# Patient Record
Sex: Female | Born: 1965 | Race: Black or African American | Hispanic: No | Smoking: Never smoker
Health system: Southern US, Community
[De-identification: ages and names within clinical notes are randomized; demographics above are authoritative.]

## PROBLEM LIST (undated history)

## (undated) DIAGNOSIS — I10 Essential (primary) hypertension: Secondary | ICD-10-CM

## (undated) DIAGNOSIS — S0591XA Unspecified injury of right eye and orbit, initial encounter: Secondary | ICD-10-CM

## (undated) DIAGNOSIS — D649 Anemia, unspecified: Secondary | ICD-10-CM

## (undated) DIAGNOSIS — Z970 Presence of artificial eye: Secondary | ICD-10-CM

## (undated) DIAGNOSIS — Z9289 Personal history of other medical treatment: Secondary | ICD-10-CM

## (undated) HISTORY — PX: OTHER SURGICAL HISTORY: SHX169

## (undated) HISTORY — DX: Essential (primary) hypertension: I10

## (undated) HISTORY — PX: TUBAL LIGATION: SHX77

---

## 1997-10-29 ENCOUNTER — Other Ambulatory Visit: Admission: RE | Admit: 1997-10-29 | Discharge: 1997-10-29 | Payer: Self-pay | Admitting: Obstetrics and Gynecology

## 2008-01-06 ENCOUNTER — Ambulatory Visit: Payer: Self-pay | Admitting: Internal Medicine

## 2008-01-06 DIAGNOSIS — I10 Essential (primary) hypertension: Secondary | ICD-10-CM

## 2008-01-06 HISTORY — DX: Essential (primary) hypertension: I10

## 2008-01-31 ENCOUNTER — Ambulatory Visit: Payer: Self-pay

## 2008-02-22 ENCOUNTER — Ambulatory Visit: Payer: Self-pay | Admitting: Internal Medicine

## 2008-02-22 LAB — CONVERTED CEMR LAB
BUN: 19 mg/dL (ref 6–23)
Basophils Absolute: 0.1 10*3/uL (ref 0.0–0.1)
Basophils Relative: 1.2 % (ref 0.0–3.0)
Calcium: 9.3 mg/dL (ref 8.4–10.5)
Creatinine, Ser: 0.6 mg/dL (ref 0.4–1.2)
Eosinophils Absolute: 0.1 10*3/uL (ref 0.0–0.7)
GFR calc Af Amer: 140 mL/min
GFR calc non Af Amer: 116 mL/min
Glucose, Bld: 93 mg/dL (ref 70–99)
Lymphocytes Relative: 28.7 % (ref 12.0–46.0)
Monocytes Absolute: 0.4 10*3/uL (ref 0.1–1.0)
Neutro Abs: 3.2 10*3/uL (ref 1.4–7.7)
Platelets: 300 10*3/uL (ref 150–400)
Saturation Ratios: 4.3 % — ABNORMAL LOW (ref 20.0–50.0)
Vitamin B-12: 424 pg/mL (ref 211–911)
WBC: 5.3 10*3/uL (ref 4.5–10.5)

## 2008-02-23 ENCOUNTER — Encounter: Payer: Self-pay | Admitting: Internal Medicine

## 2008-03-28 ENCOUNTER — Encounter: Payer: Self-pay | Admitting: Obstetrics & Gynecology

## 2008-03-28 ENCOUNTER — Ambulatory Visit: Payer: Self-pay | Admitting: Obstetrics and Gynecology

## 2008-03-28 LAB — CONVERTED CEMR LAB
Trich, Wet Prep: NONE SEEN
Yeast Wet Prep HPF POC: NONE SEEN

## 2008-05-25 ENCOUNTER — Ambulatory Visit: Payer: Self-pay | Admitting: Family Medicine

## 2010-01-24 ENCOUNTER — Inpatient Hospital Stay (HOSPITAL_COMMUNITY)
Admission: AD | Admit: 2010-01-24 | Discharge: 2010-01-24 | Payer: Self-pay | Source: Home / Self Care | Attending: Family Medicine | Admitting: Family Medicine

## 2010-02-12 ENCOUNTER — Ambulatory Visit: Payer: Self-pay | Admitting: Obstetrics and Gynecology

## 2010-02-13 ENCOUNTER — Encounter: Payer: Self-pay | Admitting: Obstetrics and Gynecology

## 2010-02-13 LAB — CONVERTED CEMR LAB: Trich, Wet Prep: NONE SEEN

## 2010-02-24 ENCOUNTER — Ambulatory Visit (HOSPITAL_COMMUNITY)
Admission: RE | Admit: 2010-02-24 | Discharge: 2010-02-24 | Payer: Self-pay | Source: Home / Self Care | Attending: Family Medicine | Admitting: Family Medicine

## 2010-03-09 ENCOUNTER — Encounter: Payer: Self-pay | Admitting: Obstetrics and Gynecology

## 2010-03-16 LAB — CONVERTED CEMR LAB
ALT: 18 units/L (ref 0–35)
Basophils Absolute: 0.1 10*3/uL (ref 0.0–0.1)
Bilirubin Urine: NEGATIVE
CO2: 29 meq/L (ref 19–32)
Calcium: 9.1 mg/dL (ref 8.4–10.5)
Cholesterol: 157 mg/dL (ref 0–200)
Creatinine, Ser: 0.6 mg/dL (ref 0.4–1.2)
Crystals: NEGATIVE
Eosinophils Absolute: 0.1 10*3/uL (ref 0.0–0.7)
GFR calc Af Amer: 140 mL/min
Glucose, Bld: 87 mg/dL (ref 70–99)
HCT: 29.9 % — ABNORMAL LOW (ref 36.0–46.0)
HDL: 76.7 mg/dL (ref >39.0)
Hemoglobin, Urine: NEGATIVE
Hemoglobin: 9.9 g/dL — ABNORMAL LOW (ref 12.0–15.0)
MCHC: 33.2 g/dL (ref 30.0–36.0)
MCV: 77.1 fL — ABNORMAL LOW (ref 78.0–100.0)
Monocytes Absolute: 0.3 10*3/uL (ref 0.1–1.0)
Monocytes Relative: 7.5 % (ref 3.0–12.0)
Neutro Abs: 2.3 10*3/uL (ref 1.4–7.7)
Nitrite: NEGATIVE
Platelets: 220 10*3/uL (ref 150–400)
RDW: 18.2 % — ABNORMAL HIGH (ref 11.5–14.6)
TSH: 0.46 microintl units/mL (ref 0.35–5.50)
Total Bilirubin: 0.6 mg/dL (ref 0.3–1.2)
Total Protein, Urine: NEGATIVE mg/dL
Total Protein: 7.1 g/dL (ref 6.0–8.3)
Triglycerides: 94 mg/dL (ref 0–149)
Urobilinogen, UA: 0.2 (ref 0.0–1.0)

## 2010-03-27 ENCOUNTER — Ambulatory Visit (INDEPENDENT_AMBULATORY_CARE_PROVIDER_SITE_OTHER): Payer: BC Managed Care – PPO | Admitting: Obstetrics and Gynecology

## 2010-03-27 ENCOUNTER — Encounter: Payer: Self-pay | Admitting: Obstetrics and Gynecology

## 2010-03-27 DIAGNOSIS — N898 Other specified noninflammatory disorders of vagina: Secondary | ICD-10-CM

## 2010-03-27 LAB — CONVERTED CEMR LAB
Trich, Wet Prep: NONE SEEN
Yeast Wet Prep HPF POC: NONE SEEN

## 2010-05-09 NOTE — Progress Notes (Unsigned)
NAME:  [name], [name] NO.:  [id number]  MEDICAL RECORD NO.:  [id number]           PATIENT TYPE:  A  LOCATION:  [facility]                   FACILITY:  [facility]  PHYSICIAN:  Argentina Donovan, MD        DATE OF BIRTH:  07-Dec-1965  DATE OF SERVICE:                                 CLINIC NOTE  The patient is a 45 year old African American female who was recently in for her annual exam, comes back today with heavy vaginal discharge and severe vulvar itching.  On examination, she has a heavy creamy discharge.  Vagina is clean, otherwise well rugated with a clean and nulliparous cervix.  Wet prep was taken.  The patient has been given a prescription for Terazol 7 cream plus Mycolog-II cream externally.  In advance of the results of the wet prep coming back and be reviewed tomorrow and if additional medication needed to be applied.  IMPRESSION:  Monilial vulvovaginitis.          ______________________________ Argentina Donovan, MD    PR/MEDQ  D:  03/27/2010  T:  03/28/2010  Job:  [id number]

## 2010-07-01 NOTE — Group Therapy Note (Signed)
NAMEDOMENIQUE, QUEST NO.:  [id number]   MEDICAL RECORD NO.:  [id number]          PATIENT TYPE:  WOC   LOCATION:  [facility]                   FACILITY:  [facility]   PHYSICIAN:  Scheryl Darter, MD       DATE OF BIRTH:  1965/04/30   DATE OF SERVICE:  03/28/2008                                  CLINIC NOTE   HISTORY OF PRESENT ILLNESS:  The patient presents for her yearly exam.  The patient is a 45 year old black female gravida 2, para 2, last  menstrual period 03/05/2008 who had tubal ligation.  She is currently  not sexually active.  Last Pap smear was March 2008.  No history of  abnormalities.  No complaints of problems with her periods.  She  recently started being followed at [facility] for management of  hypertension.   PAST MEDICAL HISTORY:  1. Hypertension.  2. Hypokalemia.  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  2. Two prior C-sections.  3. Tubal ligation.   FAMILY HISTORY:  Breast cancer, coronary heart disease and hypertension.   SOCIAL HISTORY:  The patient is a nonsmoker.  She denies alcohol use.  She is currently not sexually active.  She denies any physical abuse.   ALLERGIES:  No known drug allergies.   MEDICATIONS:  1. Benicar 20 mg daily.  2. K-Lor 20 mEq 1 p.o. daily.   REVIEW OF SYSTEMS:  Negative for any bowel complaints, stomach problems  or bladder symptoms.  She has had some weight gain since starting  treatment for hypertension.   PHYSICAL EXAMINATION:  GENERAL APPEARANCE:  No acute distress.  VITAL SIGNS:  Weight 98.5 pounds, height 4 foot 11, BP 122/81, pulse 88,  temperature 97.8.  CHEST:  Clear.  CARDIOVASCULAR:  Heart regular rate and rhythm.  BREASTS:  Symmetric without masses, but with multiple cystic cutaneous  lesions over body.  ABDOMEN:  Flat, soft and nontender.  EXTREMITIES:  No swelling.  PELVIC:  Normal external  genitalia.  Vagina and cervix appear normal.  Pap was done.  Uterus is normal size.  No adnexal masses or tenderness.  There is slight discharge and she had noticed a malodorous discharge  prior to this with a positive over-the-counter for Trichomonas,  bacterial vaginosis and yeast.   IMPRESSION:  1. Normal gynecologic exam.  2. Probable bacterial vaginosis.   PLAN:  Flagyl 2 grams p.o. single dose.  Wet mount was sent today.  Return for yearly exams.  I have recommended she schedule a screening  mammogram.  She will continue to follow with her internist at [facility].      Scheryl Darter, MD     JA/MEDQ  D:  03/28/2008  T:  03/28/2008  Job:  [id number]

## 2010-07-01 NOTE — Group Therapy Note (Signed)
NAMEALEXANDRIA, [name] NO.:  [id number]   MEDICAL RECORD NO.:  [id number]          PATIENT TYPE:  WOC   LOCATION:  [facility]                   FACILITY:  [facility]   PHYSICIAN:  Scheryl Darter, MD       DATE OF BIRTH:  [birth year]   DATE OF SERVICE:  05/25/2008                                  CLINIC NOTE   CHIEF COMPLAINT:  Vaginal discharge.   The patient is a 45 year old black female, gravida 2, para 2, last  menstrual period on May 16, 2008, status post tubal ligation.  She  says that since the onset of her last period , she has had chalky  vaginal discharge and a fishy odor.  She thinks it may be bacterial  vaginosis.  She was recently treated for bacterial vaginosis with  Flagyl, single dose.  No complaints of pain or fever or vaginal itching.   PHYSICAL EXAMINATION:  GENERAL:  The patient is in no acute distress.  PELVIC:  External genitalia and vagina showed slight white discharge,  which appears to be most consistent with yeast.  Cervix is normal.  Bimanual exam was deferred.  Wet mount today showed normal epithelial  cells, few white cells, a few hyphae, and other fungal elements.   IMPRESSION:  Vaginal yeast infection.   PLAN:  Diflucan 150 mg p.o. single dose.  The patient is to report if  symptoms does not improve.      Scheryl Darter, MD     JA/MEDQ  D:  05/25/2008  T:  05/25/2008  Job:  [id number]

## 2011-04-03 ENCOUNTER — Encounter: Payer: Self-pay | Admitting: Family

## 2011-04-03 ENCOUNTER — Ambulatory Visit (INDEPENDENT_AMBULATORY_CARE_PROVIDER_SITE_OTHER): Payer: BC Managed Care – PPO | Admitting: Family

## 2011-04-03 DIAGNOSIS — L723 Sebaceous cyst: Secondary | ICD-10-CM

## 2011-04-03 DIAGNOSIS — Z01419 Encounter for gynecological examination (general) (routine) without abnormal findings: Secondary | ICD-10-CM

## 2011-04-03 DIAGNOSIS — L728 Other follicular cysts of the skin and subcutaneous tissue: Secondary | ICD-10-CM | POA: Insufficient documentation

## 2011-04-03 DIAGNOSIS — N852 Hypertrophy of uterus: Secondary | ICD-10-CM

## 2011-04-03 MED ORDER — METRONIDAZOLE 500 MG PO TABS: 500.0000 mg | ORAL_TABLET | Freq: Two times a day (BID) | ORAL | Status: AC

## 2011-04-03 NOTE — Progress Notes (Signed)
  Subjective:     [name] is a 46 y.o. female here for a routine exam.  Current complaints: white vaginal discharge with odor; intermittent x 6 years, notices it more after cycle.     Gynecologic History Patient's last menstrual period was 03/18/2011. Contraception: tubal ligation Last Pap: 01/2010. Results were: normal Last mammogram: January 2011. Results were: normal.  Plans to schedule appointment.  Obstetric History OB History    Grav Para Term Preterm Abortions TAB SAB Ect Mult Living   2 2 2       2      # Outc Date GA Lbr Len/2nd Wgt Sex Del Anes PTL Lv   1 TRM            2 TRM               The following portions of the patient's history were reviewed and updated as appropriate: allergies, current medications, past family history, past medical history, past social history, past surgical history and problem list. Review of Systems Pertinent items are noted in HPI.    Objective:   BP 145/89  Pulse 81  Temp(Src) 98.9 F (37.2 C) (Oral)  Ht 4\' 11"  (1.499 m)  Wt 102 lb 3.2 oz (46.358 kg)  BMI 20.64 kg/m2  LMP 03/18/2011  General appearance: alert, cooperative and appears stated age Head: Normocephalic, without obvious abnormality, atraumatic Neck: no adenopathy, no carotid bruit, no JVD, supple, symmetrical, trachea midline and thyroid not enlarged, symmetric, no tenderness/mass/nodules Lungs: clear to auscultation bilaterally Breasts: normal appearance, no tenderness, No nipple retraction or dimpling, No nipple discharge or bleeding, No axillary or supraclavicular adenopathy, scattered nodule masses scattered through (related to chronic skin condition since 7th grade) Taught monthly breast self examination Heart: regular rate and rhythm, S1, S2 normal, no murmur, click, rub or gallop Abdomen: soft, non-tender; bowel sounds normal; no masses,  no organomegaly Pelvic: cervix normal in appearance, external genitalia normal, no adnexal masses or tenderness, no  cervical motion tenderness, rectovaginal septum normal, uterus enlarged and vagina normal without discharge Skin: Skin color, texture, turgor normal. No rashes.  Scattered nodular lesions throughout body   Assessment:    Healthy female exam.   Vaginal Discharge Enlarged Uterus Plan:    Contraception: tubal ligation.   Pap w/GC/CT Wet Prep Pelvic ultrasound Schedule mammogram RX flagyl

## 2011-04-04 LAB — WET PREP, GENITAL: Trich, Wet Prep: NONE SEEN

## 2011-04-09 ENCOUNTER — Ambulatory Visit (HOSPITAL_COMMUNITY)
Admission: RE | Admit: 2011-04-09 | Discharge: 2011-04-09 | Disposition: A | Discharge status: Home / Self Care | Payer: BC Managed Care – PPO | Source: Ambulatory Visit | Attending: Family | Admitting: Family

## 2011-04-09 DIAGNOSIS — N852 Hypertrophy of uterus: Secondary | ICD-10-CM | POA: Insufficient documentation

## 2011-04-09 DIAGNOSIS — D259 Leiomyoma of uterus, unspecified: Secondary | ICD-10-CM | POA: Insufficient documentation

## 2011-04-11 ENCOUNTER — Encounter: Payer: Self-pay | Admitting: Family

## 2011-04-11 DIAGNOSIS — D219 Benign neoplasm of connective and other soft tissue, unspecified: Secondary | ICD-10-CM | POA: Insufficient documentation

## 2011-04-11 HISTORY — DX: Benign neoplasm of connective and other soft tissue, unspecified: D21.9

## 2011-04-16 ENCOUNTER — Other Ambulatory Visit: Payer: Self-pay | Admitting: Obstetrics and Gynecology

## 2011-04-16 ENCOUNTER — Telehealth: Payer: Self-pay | Admitting: *Deleted

## 2011-04-16 DIAGNOSIS — B379 Candidiasis, unspecified: Secondary | ICD-10-CM

## 2011-04-16 MED ORDER — FLUCONAZOLE 150 MG PO TABS: 150.0000 mg | ORAL_TABLET | Freq: Once | ORAL | Status: AC

## 2011-04-16 NOTE — Telephone Encounter (Signed)
Patient notified of transvaginal ultrasound result-- per Maylon Cos, [facility] result shows fibroids which are non-cancerous; requires no treatment unless it is causing her pain or having abnormal bleed. At that point she can make an appointment for further evaluation. Patient agrees.  Patient also added a c/o yellow vaginal discharge with odor and itch. Per Maylon Cos- ok to Rx Diflucan 150mg  one time dose--per standing orders. Wet prep from last visit shows moderate for yeast also. Pt agrees and Rx sent.

## 2011-04-16 NOTE — Telephone Encounter (Signed)
Patient called and left a message requesting test results of Ultrasound done 04/09/11. States can be reached at home number [phone] until 12 or cell phone   [phone] after 2 today

## 2012-11-21 ENCOUNTER — Inpatient Hospital Stay (HOSPITAL_COMMUNITY)
Admission: AD | Admit: 2012-11-21 | Discharge: 2012-11-21 | Disposition: A | Discharge status: Home / Self Care | Payer: Medicaid Other | Source: Ambulatory Visit | Attending: Obstetrics & Gynecology | Admitting: Obstetrics & Gynecology

## 2012-11-21 DIAGNOSIS — N764 Abscess of vulva: Secondary | ICD-10-CM | POA: Insufficient documentation

## 2012-11-21 MED ORDER — CEPHALEXIN 500 MG PO CAPS: 500.0000 mg | ORAL_CAPSULE | Freq: Four times a day (QID) | ORAL | Status: DC

## 2012-11-21 MED ORDER — LIDOCAINE HCL (PF) 1 % IJ SOLN
INTRAMUSCULAR | Status: DC
Start: 2012-11-21 — End: 2012-11-21
  Filled 2012-11-21: qty 30

## 2012-11-21 MED ORDER — TRAMADOL HCL 50 MG PO TABS: 50.0000 mg | ORAL_TABLET | Freq: Four times a day (QID) | ORAL | Status: DC | PRN

## 2012-11-21 NOTE — MAU Note (Signed)
Patient states she had an abscess about one year ago that had to be I & D's. States states another abscess started about one week and is now large and painful on the right labia.

## 2012-11-21 NOTE — MAU Provider Note (Signed)
History     CSN: [id number]  Arrival date and time: 11/21/12 6295   None     Chief Complaint  Patient presents with  . Abscess   HPI This is a 46 y.o. female who presents with c/o right labial abscess with a smaller one beneath it. Has had them in this area before. States thinks they come from shaving. Has had I&D done before.   RN Note: Patient states she had an abscess about one year ago that had to be I & D's. States states another abscess started about one week and is now large and painful on the right labia.        OB History   Grav Para Term Preterm Abortions TAB SAB Ect Mult Living   2 2 2       2       Past Medical History  Diagnosis Date  . Hypertension     Past Surgical History  Procedure Laterality Date  . Cesarean section      2 previous  . Tubal ligation      Family History  Problem Relation Age of Onset  . Hypertension Mother   . Cancer Maternal Aunt     breast    History  Substance Use Topics  . Smoking status: Never Smoker   . Smokeless tobacco: Never Used  . Alcohol Use: No    Allergies: No Known Allergies  Prescriptions prior to admission  Medication Sig Dispense Refill  . olmesartan (BENICAR) 20 MG tablet Take 20 mg by mouth daily.        Review of Systems  Constitutional: Negative for fever, chills and malaise/fatigue.  Gastrointestinal: Negative for nausea, vomiting, abdominal pain, diarrhea and constipation.  Genitourinary:       States has had enlarged inguinal lymph nodes on right "since 1996"  As well as some in middle abdomen. States she was told they "are fine".   Physical Exam   Blood pressure 137/89, pulse 80, temperature 98.3 F (36.8 C), temperature source Oral, resp. rate 18, height 5\' 1"  (1.549 m), weight 53.252 kg (117 lb 6.4 oz), last menstrual period 11/11/2012, SpO2 100.00%.  Physical Exam  Constitutional: She is oriented to person, place, and time. She appears well-developed and well-nourished. No distress.   Cardiovascular: Normal rate.   Respiratory: Effort normal.  GI: Soft. She exhibits mass (Several half centimeter nodes palpable in Right inguinal area, nontender, not inflamed). She exhibits no distension. There is no tenderness. There is no rebound and no guarding.  Genitourinary:     Two areas of fluctuance right labia   Musculoskeletal: Normal range of motion.  Neurological: She is alert and oriented to person, place, and time.  Skin: Skin is warm and dry.  Psychiatric: She has a normal mood and affect.    MAU Course  Procedures Labial Abscess I&D  Enlarged abscess palpated on right labia and a second smaller one just below that.   Anterior one is 1.5x3.5cm.  Other is 1x2.   Written informed consent was obtained.  Discussed complications and possible outcomes of procedure including recurrence of cyst, scarring leading to infecton, bleeding, dyspareunia, distortion of anatomy.  Patient was examined in the dorsal lithotomy position and mass was identified.  The area was prepped with Iodine and draped in a sterile manner. 1% Lidocaine (3 ml) was then used to infiltrate area on abscesses.    A 2 mm incision was made using a sterile scapel. Upon palpation of the mass, a moderate  amount of purulent drainage was expressed through the incision. A hemostat was used to break up loculations, which resulted in expression of slightly more purulent drainage. Smaller abscess treated similarly but there was only scant fluid returned there.  Patient tolerated the procedure well. - Keflex bid x 7 days for treatment - Recommended Sitz baths bid and Motrin and Tramadol was given  prn pain.   She was told to call to be examined if she experiences increasing swelling, pain, vaginal discharge, or fever.  - She was instructed to wear a peripad to absorb discharge, and to maintain pelvic rest while healing.  .   Assessment and Plan  A:  Right labial abscesses      Successful drainage of larger abscess  P:   Discharge        Rx Keflex x 7d       Warm soaks       Follow up in clinic     [facility] 11/21/2012, 1:40 PM

## 2012-11-21 NOTE — MAU Provider Note (Signed)
Attestation of Attending Supervision of Advanced Practitioner (CNM/NP): Evaluation and management procedures were performed by the Advanced Practitioner under my supervision and collaboration.  I have reviewed the Advanced Practitioner's note and chart, and I agree with the management and plan.  HARRAWAY-SMITH, Kaylah Chiasson 6:45 PM     

## 2013-12-18 ENCOUNTER — Encounter: Payer: Self-pay | Admitting: Family

## 2016-11-19 ENCOUNTER — Encounter (HOSPITAL_COMMUNITY): Payer: Self-pay | Admitting: *Deleted

## 2016-11-19 ENCOUNTER — Inpatient Hospital Stay (HOSPITAL_COMMUNITY)
Admission: AD | Admit: 2016-11-19 | Discharge: 2016-11-20 | DRG: 812 | Disposition: A | Discharge status: Home / Self Care | Payer: BLUE CROSS/BLUE SHIELD | Source: Ambulatory Visit | Attending: Obstetrics and Gynecology | Admitting: Obstetrics and Gynecology

## 2016-11-19 DIAGNOSIS — D252 Subserosal leiomyoma of uterus: Secondary | ICD-10-CM | POA: Diagnosis present

## 2016-11-19 DIAGNOSIS — D219 Benign neoplasm of connective and other soft tissue, unspecified: Secondary | ICD-10-CM | POA: Diagnosis present

## 2016-11-19 DIAGNOSIS — D259 Leiomyoma of uterus, unspecified: Secondary | ICD-10-CM

## 2016-11-19 DIAGNOSIS — D62 Acute posthemorrhagic anemia: Principal | ICD-10-CM | POA: Diagnosis present

## 2016-11-19 DIAGNOSIS — N939 Abnormal uterine and vaginal bleeding, unspecified: Secondary | ICD-10-CM | POA: Diagnosis present

## 2016-11-19 DIAGNOSIS — Z9289 Personal history of other medical treatment: Secondary | ICD-10-CM

## 2016-11-19 HISTORY — DX: Acute posthemorrhagic anemia: D62

## 2016-11-19 HISTORY — DX: Essential (primary) hypertension: I10

## 2016-11-19 HISTORY — DX: Personal history of other medical treatment: Z92.89

## 2016-11-19 LAB — ABO/RH: ABO/RH(D): B POS

## 2016-11-19 LAB — POCT PREGNANCY, URINE: Preg Test, Ur: NEGATIVE

## 2016-11-19 LAB — CBC
HEMATOCRIT: 18.7 % — AB (ref 36.0–46.0)
HEMOGLOBIN: 5.7 g/dL — AB (ref 12.0–15.0)
MCH: 21.2 pg — AB (ref 26.0–34.0)
MCHC: 30.5 g/dL (ref 30.0–36.0)
MCV: 69.5 fL — ABNORMAL LOW (ref 78.0–100.0)
Platelets: 210 10*3/uL (ref 150–400)
RBC: 2.69 MIL/uL — ABNORMAL LOW (ref 3.87–5.11)
RDW: 21.7 % — AB (ref 11.5–15.5)
WBC: 5.9 10*3/uL (ref 4.0–10.5)

## 2016-11-19 LAB — URINALYSIS, ROUTINE W REFLEX MICROSCOPIC
Bilirubin Urine: NEGATIVE
Glucose, UA: NEGATIVE mg/dL
Ketones, ur: NEGATIVE mg/dL
Leukocytes, UA: NEGATIVE
NITRITE: NEGATIVE
PROTEIN: NEGATIVE mg/dL
Specific Gravity, Urine: 1.018 (ref 1.005–1.030)
pH: 5 (ref 5.0–8.0)

## 2016-11-19 LAB — PROTIME-INR
INR: 1.04
Prothrombin Time: 13.5 seconds (ref 11.4–15.2)

## 2016-11-19 LAB — WET PREP, GENITAL
Sperm: NONE SEEN
TRICH WET PREP: NONE SEEN
YEAST WET PREP: NONE SEEN

## 2016-11-19 LAB — APTT: APTT: 31 s (ref 24–36)

## 2016-11-19 LAB — PREPARE RBC (CROSSMATCH)

## 2016-11-19 MED ORDER — SODIUM CHLORIDE 0.9 % IV SOLN: Freq: Once | INTRAVENOUS | Status: DC

## 2016-11-19 MED ORDER — TRANEXAMIC ACID 1000 MG/10ML IV SOLN
500.0000 mg | Freq: Three times a day (TID) | INTRAVENOUS | Status: DC
  Administered 2016-11-19 – 2016-11-20 (×2): 500 mg via INTRAVENOUS
  Filled 2016-11-19 (×4): qty 5

## 2016-11-19 NOTE — MAU Provider Note (Signed)
History     CSN: [id number] Arrival date and time: 11/19/16 8938  First Provider Initiated Contact with Patient 11/19/16 2023      Chief Complaint  Patient presents with  . Vaginal Bleeding    HPI: [name] is a 51 y.o. G31P2002 female with history of fibroids who present to MAU with c/c of heavy vaginal bleeding. She reports that she had had her menses since May, then it started on 11/01/16 and has persisted. For the last 4 days menses has gotten a lot heavier, and passing some clots. She reports that she "fills up a tampon" if she sneezes.  For the last couple of days she has been feeling dizzy and mildly nauseous. Denies any SOB, DOE or chest, fever, chills, or any other concerns. She is otherwise healthy, other than history of HTN (has not been taking BP med d/t cost). Denies h/o DVT/PE, CVA, MI, or smoking.   Past obstetric history: OB History  Gravida Para Term Preterm AB Living  2 2 2     2   SAB TAB Ectopic Multiple Live Births               # Outcome Date GA Lbr Len/2nd Weight Sex Delivery Anes PTL Lv  2 Term           1 Term               Past Medical History:  Diagnosis Date  . Hypertension     Past Surgical History:  Procedure Laterality Date  . CESAREAN SECTION     2 previous  . TUBAL LIGATION      Family History  Problem Relation Age of Onset  . Hypertension Mother   . Cancer Maternal Aunt        breast    Social History  Substance Use Topics  . Smoking status: Never Smoker  . Smokeless tobacco: Never Used  . Alcohol use No    Allergies: No Known Allergies  Prescriptions Prior to Admission  Medication Sig Dispense Refill Last Dose  . Acetaminophen-Caff-Pyrilamine (MIDOL COMPLETE) 500-60-15 MG TABS Take 2 tablets by mouth once. For pain.   11/19/2016 at Unknown time  . Naproxen Sodium (ALEVE PO) Take by mouth.   11/18/2016 at Unknown time  . cephALEXin (KEFLEX) 500 MG capsule Take 1 capsule (500 mg total) by mouth 4 (four) times daily. 28  capsule 0   . olmesartan (BENICAR) 20 MG tablet Take 20 mg by mouth daily.   Past Week at Unknown  . traMADol (ULTRAM) 50 MG tablet Take 1 tablet (50 mg total) by mouth every 6 (six) hours as needed for pain. 30 tablet 0     Review of Systems - Negative except for what is mentioned in HPI.  Physical Exam   Blood pressure (!) 148/82, pulse 99, temperature 98.3 F (36.8 C), temperature source Oral, resp. rate 16, height 5' (1.524 m), weight 108 lb (49 kg).  Constitutional: Thing female in no acute distress.  HENT: [place]/AT, normal oropharynx mucosa. MMM Eyes: normal conjunctivae, no scleral icterus Cardiovascular: normal rate, regular rhythm Respiratory: normal effort, lungs CTAB.  GI: Abd soft, non-tender, uterus palpable to just below the umbilicus  Pelvic: NEFG, moderate amount of blood in vaginal vault with small clots, cervix normal appearing. Uterus ~18-week size on bimanual exam with palpable fibroids.  MSK: Extremities nontender, no edema, normal ROM Neurologic: Alert and oriented x 4. Psych: Normal mood and affect Skin: warm and dry  MAU  Course  Procedures  MDM. Patient seen and examined as above. Hemodynamically stable. Pelvic exam as above, cultures collected. CBC ordered.  U/S from 2013 reviewed - showed multiple fibroids.  CBC: Hgb 5.7.  Discussed with Dr. Ilda Basset need for admission for blood transfusion. Will manage bleeding with IV Lysteda.  Assessment and Plan  Assessment: 1. Acute blood loss anemia   2. Abnormal uterine bleeding (AUB)   3. Uterine leiomyoma, unspecified location     Plan: --Admit to [facility] --4 units of PRBCs ordered --Lysteda 500 mg q8h --Check coag studies and TSH --Gyn U/S ordered --NPO for now  Sarha Bartelt, Jenne Pane, MD 11/19/2016 8:51 PM

## 2016-11-19 NOTE — MAU Note (Signed)
Pt presents to MAU c/o vaginal bleeding that started on 11/01/16. Pt states her last regular period was May 13th 2018 and she has not had a period since then and now she is bleeding and passing "massive fleshy clots." Pt states she is using overnight pads and she has used up to 4 a day as well as super tampons. Pt reports if she sneezes she fills up the whole tampon. Pt states she has been feeling dizzy and nauseated the last two days.

## 2016-11-20 ENCOUNTER — Encounter (HOSPITAL_COMMUNITY): Payer: Self-pay | Admitting: Orthopedic Surgery

## 2016-11-20 ENCOUNTER — Inpatient Hospital Stay (HOSPITAL_COMMUNITY): Payer: BLUE CROSS/BLUE SHIELD

## 2016-11-20 DIAGNOSIS — D259 Leiomyoma of uterus, unspecified: Secondary | ICD-10-CM

## 2016-11-20 DIAGNOSIS — N939 Abnormal uterine and vaginal bleeding, unspecified: Secondary | ICD-10-CM

## 2016-11-20 DIAGNOSIS — D62 Acute posthemorrhagic anemia: Secondary | ICD-10-CM | POA: Diagnosis not present

## 2016-11-20 HISTORY — DX: Abnormal uterine and vaginal bleeding, unspecified: N93.9

## 2016-11-20 LAB — CBC
HCT: 34.1 % — ABNORMAL LOW (ref 36.0–46.0)
Hemoglobin: 11.3 g/dL — ABNORMAL LOW (ref 12.0–15.0)
MCH: 24.9 pg — AB (ref 26.0–34.0)
MCHC: 33.1 g/dL (ref 30.0–36.0)
MCV: 75.3 fL — ABNORMAL LOW (ref 78.0–100.0)
PLATELETS: 175 10*3/uL (ref 150–400)
RBC: 4.53 MIL/uL (ref 3.87–5.11)
RDW: 19 % — AB (ref 11.5–15.5)
WBC: 9.2 10*3/uL (ref 4.0–10.5)

## 2016-11-20 LAB — TSH: TSH: 1.362 u[IU]/mL (ref 0.350–4.500)

## 2016-11-20 LAB — GC/CHLAMYDIA PROBE AMP (~~LOC~~) NOT AT ARMC
Chlamydia: NEGATIVE
Neisseria Gonorrhea: NEGATIVE

## 2016-11-20 MED ORDER — FERROUS SULFATE 325 (65 FE) MG PO TABS: 325.0000 mg | ORAL_TABLET | Freq: Two times a day (BID) | ORAL | 2 refills | Status: DC

## 2016-11-20 MED ORDER — TRANEXAMIC ACID 650 MG PO TABS: 1300.0000 mg | ORAL_TABLET | Freq: Three times a day (TID) | ORAL | 2 refills | Status: DC

## 2016-11-20 MED ORDER — LEUPROLIDE ACETATE 3.75 MG IM KIT
3.7500 mg | PACK | Freq: Once | INTRAMUSCULAR | Status: AC
  Administered 2016-11-20: 3.75 mg via INTRAMUSCULAR
  Filled 2016-11-20: qty 3.75

## 2016-11-20 MED ORDER — TRAMADOL HCL 50 MG PO TABS: 50.0000 mg | ORAL_TABLET | Freq: Four times a day (QID) | ORAL | 0 refills | Status: DC | PRN

## 2016-11-20 MED ORDER — IBUPROFEN 800 MG PO TABS: 800.0000 mg | ORAL_TABLET | Freq: Three times a day (TID) | ORAL | 3 refills | Status: DC | PRN

## 2016-11-20 NOTE — Discharge Summary (Signed)
Physician Discharge Summary  Patient ID: [name] MRN: [id number] DOB/AGE: [birth year] 52 y.o.  Admit date: 11/19/2016 Discharge date: 11/20/2016  Admission and Discharge Diagnoses:  Principal Problem:   Acute blood loss anemia Active Problems:   Fibroids   Abnormal uterine bleeding (AUB)  Discharged Condition: stable  Hospital Course: Patient was admitted with symptomatic anemia with hemoglobin of 5.7, in the setting of AUB and fibroids.  She was transfused with 4 units pRBCs, and treated with IV tranexamic acid which slowed her bleeding down significantly. Her hemoglobin increased to 11.3.  She was given Lupron 3.75 mg IM x 1, and prescription for Lysteda.  Her ultrasound showed fibroids and an enlarged endometrial stripe of 3.1 cm. Patient will undergo pap and endometrial biopsy outpatient in the next 1-2 weeks for evaluation. Bleeding precautions reviewed.   Significant Diagnostic Studies: Results for orders placed or performed during the hospital encounter of 11/19/16 (from the past 72 hour(s))  Urinalysis, Routine w reflex microscopic     Status: Abnormal   Collection Time: 11/19/16  7:44 PM  Result Value Ref Range   Color, Urine YELLOW YELLOW   APPearance HAZY (A) CLEAR   Specific Gravity, Urine 1.018 1.005 - 1.030   pH 5.0 5.0 - 8.0   Glucose, UA NEGATIVE NEGATIVE mg/dL   Hgb urine dipstick LARGE (A) NEGATIVE   Bilirubin Urine NEGATIVE NEGATIVE   Ketones, ur NEGATIVE NEGATIVE mg/dL   Protein, ur NEGATIVE NEGATIVE mg/dL   Nitrite NEGATIVE NEGATIVE   Leukocytes, UA NEGATIVE NEGATIVE   RBC / HPF 6-30 0 - 5 RBC/hpf   WBC, UA 0-5 0 - 5 WBC/hpf   Bacteria, UA RARE (A) NONE SEEN   Squamous Epithelial / LPF 0-5 (A) NONE SEEN   Mucus PRESENT   CBC     Status: Abnormal   Collection Time: 11/19/16  8:22 PM  Result Value Ref Range   WBC 5.9 4.0 - 10.5 K/uL   RBC 2.69 (L) 3.87 - 5.11 MIL/uL   Hemoglobin 5.7 (LL) 12.0 - 15.0 g/dL    Comment: REPEATED TO VERIFY CRITICAL  RESULT CALLED TO, READ BACK BY AND VERIFIED WITH: FIELDS L AT 2109 ON 100418 BY FORSYTH K    HCT 18.7 (L) 36.0 - 46.0 %   MCV 69.5 (L) 78.0 - 100.0 fL   MCH 21.2 (L) 26.0 - 34.0 pg   MCHC 30.5 30.0 - 36.0 g/dL   RDW 21.7 (H) 11.5 - 15.5 %   Platelets 210 150 - 400 K/uL    Comment: REPEATED TO VERIFY SPECIMEN CHECKED FOR CLOTS   Wet prep, genital     Status: Abnormal   Collection Time: 11/19/16  8:41 PM  Result Value Ref Range   Yeast Wet Prep HPF POC NONE SEEN NONE SEEN   Trich, Wet Prep NONE SEEN NONE SEEN   Clue Cells Wet Prep HPF POC PRESENT (A) NONE SEEN   WBC, Wet Prep HPF POC FEW (A) NONE SEEN    Comment: MANY BACTERIA SEEN   Sperm NONE SEEN   Pregnancy, urine POC     Status: None   Collection Time: 11/19/16  8:56 PM  Result Value Ref Range   Preg Test, Ur NEGATIVE NEGATIVE    Comment:        THE SENSITIVITY OF THIS METHODOLOGY IS >24 mIU/mL   Type and screen [place]     Status: None (Preliminary result)   Collection Time: 11/19/16  9:55 PM  Result Value Ref  Range   ABO/RH(D) B POS    Antibody Screen NEG    Sample Expiration 11/22/2016    Unit Number [id number]    Blood Component Type RED CELLS,LR    Unit division 00    Status of Unit ISSUED    Transfusion Status OK TO TRANSFUSE    Crossmatch Result Compatible    Unit Number [id number]    Blood Component Type RED CELLS,LR    Unit division 00    Status of Unit ISSUED    Transfusion Status OK TO TRANSFUSE    Crossmatch Result Compatible    Unit Number [id number]    Blood Component Type RED CELLS,LR    Unit division 00    Status of Unit ISSUED    Transfusion Status OK TO TRANSFUSE    Crossmatch Result Compatible    Unit Number [id number]    Blood Component Type RED CELLS,LR    Unit division 00    Status of Unit ISSUED,FINAL    Transfusion Status OK TO TRANSFUSE    Crossmatch Result Compatible   ABO/Rh     Status: None   Collection Time: 11/19/16  9:55 PM  Result Value  Ref Range   ABO/RH(D) B POS   Prepare RBC     Status: None   Collection Time: 11/19/16 10:00 PM  Result Value Ref Range   Order Confirmation ORDER PROCESSED BY BLOOD BANK   Protime-INR     Status: None   Collection Time: 11/19/16 11:15 PM  Result Value Ref Range   Prothrombin Time 13.5 11.4 - 15.2 seconds   INR 1.04   PTT     Status: None   Collection Time: 11/19/16 11:15 PM  Result Value Ref Range   aPTT 31 24 - 36 seconds  TSH     Status: None   Collection Time: 11/19/16 11:15 PM  Result Value Ref Range   TSH 1.362 0.350 - 4.500 uIU/mL    Comment: Performed by a 3rd Generation assay with a functional sensitivity of <=0.01 uIU/mL.  CBC     Status: Abnormal   Collection Time: 11/20/16 12:02 PM  Result Value Ref Range   WBC 9.2 4.0 - 10.5 K/uL   RBC 4.53 3.87 - 5.11 MIL/uL   Hemoglobin 11.3 (L) 12.0 - 15.0 g/dL    Comment: REPEATED TO VERIFY DELTA CHECK NOTED POST TRANSFUSION SPECIMEN    HCT 34.1 (L) 36.0 - 46.0 %   MCV 75.3 (L) 78.0 - 100.0 fL    Comment: REPEATED TO VERIFY DELTA CHECK NOTED POST TRANSFUSION SPECIMEN    MCH 24.9 (L) 26.0 - 34.0 pg   MCHC 33.1 30.0 - 36.0 g/dL   RDW 19.0 (H) 11.5 - 15.5 %   Platelets 175 150 - 400 K/uL    US Pelvis Transvanginal Non-ob (tv Only)  Result Date: 11/20/2016 CLINICAL DATA:  Uterine fibroids, heavy vaginal bleeding, anemia EXAM: TRANSABDOMINAL AND TRANSVAGINAL ULTRASOUND OF PELVIS TECHNIQUE: Both transabdominal and transvaginal ultrasound examinations of the pelvis were performed. Transabdominal technique was performed for global imaging of the pelvis including uterus, ovaries, adnexal regions, and pelvic cul-de-sac. It was necessary to proceed with endovaginal exam following the transabdominal exam to visualize the endometrium. COMPARISON:  04/09/2011 FINDINGS: Uterus Measurements: 13.1 x 10.9 x 9.8 cm. Multiple uterine fibroids, including: --4.5 x 5.0 x 4.9 cm subserosal fibroid in the right uterine fundus --3.2 x 3.3 x 3.1 cm  subserosal fibroid in the left posterior uterine body --4.2 x 4.0 x 3.5 cm subserosal  fibroid in the right lower uterine segment Endometrium Thickness: 3.1 cm.  Thickened/ heterogeneous with endometrial fluid. Right ovary Measurements: 3.3 x 2.7 x 2.7 cm. Normal appearance/no adnexal mass. Left ovary Measurements: 2.4 x 2.3 x 1.6 cm. Normal appearance/no adnexal mass. Other findings No abnormal free fluid. IMPRESSION: Multiple uterine fibroids, measuring up to 5.0 cm, as above. Endometrial complex is thickened/ heterogeneous, measuring 3.1 cm, as above. If bleeding remains unresponsive to hormonal or medical therapy, focal lesion work-up with sonohysterogram should be considered. Endometrial biopsy should also be considered in pre-menopausal patients at high risk for endometrial carcinoma. (Ref: Radiological Reasoning: Algorithmic Workup of Abnormal Vaginal Bleeding with Endovaginal Sonography and Sonohysterography. AJR 2008; 151:V61-60) Electronically Signed   By: Julian Hy M.D.   On: 11/20/2016 11:41   US Pelvis (transabdominal Only)  Result Date: 11/20/2016 CLINICAL DATA:  Uterine fibroids, heavy vaginal bleeding, anemia EXAM: TRANSABDOMINAL AND TRANSVAGINAL ULTRASOUND OF PELVIS TECHNIQUE: Both transabdominal and transvaginal ultrasound examinations of the pelvis were performed. Transabdominal technique was performed for global imaging of the pelvis including uterus, ovaries, adnexal regions, and pelvic cul-de-sac. It was necessary to proceed with endovaginal exam following the transabdominal exam to visualize the endometrium. COMPARISON:  04/09/2011 FINDINGS: Uterus Measurements: 13.1 x 10.9 x 9.8 cm. Multiple uterine fibroids, including: --4.5 x 5.0 x 4.9 cm subserosal fibroid in the right uterine fundus --3.2 x 3.3 x 3.1 cm subserosal fibroid in the left posterior uterine body --4.2 x 4.0 x 3.5 cm subserosal fibroid in the right lower uterine segment Endometrium Thickness: 3.1 cm.  Thickened/  heterogeneous with endometrial fluid. Right ovary Measurements: 3.3 x 2.7 x 2.7 cm. Normal appearance/no adnexal mass. Left ovary Measurements: 2.4 x 2.3 x 1.6 cm. Normal appearance/no adnexal mass. Other findings No abnormal free fluid. IMPRESSION: Multiple uterine fibroids, measuring up to 5.0 cm, as above. Endometrial complex is thickened/ heterogeneous, measuring 3.1 cm, as above. If bleeding remains unresponsive to hormonal or medical therapy, focal lesion work-up with sonohysterogram should be considered. Endometrial biopsy should also be considered in pre-menopausal patients at high risk for endometrial carcinoma. (Ref: Radiological Reasoning: Algorithmic Workup of Abnormal Vaginal Bleeding with Endovaginal Sonography and Sonohysterography. AJR 2008; 737:T06-26) Electronically Signed   By: Julian Hy M.D.   On: 11/20/2016 11:41   Discharge Exam: Blood pressure 132/82, pulse 62, temperature 98.4 F (36.9 C), temperature source Oral, resp. rate 16, height 5' (1.524 m), weight 108 lb (49 kg), SpO2 100 %. General appearance: Alert and no distress Resp: Clear to auscultation bilaterally Cardio: Regular rate and rhythm Abdomen: NT abdomen. Fibroid uterus palpated. Extremities: Extremities normal, atraumatic, no cyanosis or edema and Homans sign is negative, no sign of DVT Pulses: 2+ and symmetric Neurologic: Alert and oriented X 3, normal strength and tone. Normal symmetric reflexes. Normal coordination and gait  Disposition: 01-Home or Self Care   Allergies as of 11/20/2016   No Known Allergies     Medication List    STOP taking these medications   ALEVE PO     TAKE these medications   ferrous sulfate 325 (65 FE) MG tablet Commonly known as:  FERROUSUL Take 1 tablet (325 mg total) by mouth 2 (two) times daily with a meal.   ibuprofen 800 MG tablet Commonly known as:  ADVIL,MOTRIN Take 1 tablet (800 mg total) by mouth 3 (three) times daily with meals as needed for headache or  moderate pain.   MIDOL COMPLETE 500-60-15 MG Tabs Generic drug:  Acetaminophen-Caff-Pyrilamine Take 2 tablets by mouth  once. For pain.   traMADol 50 MG tablet Commonly known as:  ULTRAM Take 1 tablet (50 mg total) by mouth every 6 (six) hours as needed for severe pain.   tranexamic acid 650 MG Tabs tablet Commonly known as:  LYSTEDA Take 2 tablets (1,300 mg total) by mouth 3 (three) times daily. Take during menses for a maximum of five days      Follow-up [facility] for [facility] Follow up in 2 week(s).   Specialty:  Obstetrics and Gynecology Why:  Endometrial surgery, pap smear, hysterectomy consult Contact information: [place] [phone]          Signed: Verita Schneiders 11/20/2016, 2:11 PM

## 2016-11-20 NOTE — Discharge Instructions (Signed)
Abnormal Uterine Bleeding Abnormal uterine bleeding means bleeding more than usual from your uterus. It can include:  Bleeding between periods.  Bleeding after sex.  Bleeding that is heavier than normal.  Periods that last longer than usual.  Bleeding after you have stopped having your period (menopause).  There are many problems that may cause this. You should see a doctor for any kind of bleeding that is not normal. Treatment depends on the cause of the bleeding. Follow these instructions at home:  Watch your condition for any changes.  Do not use tampons, douche, or have sex, if your doctor tells you not to.  Change your pads often.  Get regular well-woman exams. Make sure they include a pelvic exam and cervical cancer screening.  Keep all follow-up visits as told by your doctor. This is important. Contact a doctor if:  The bleeding lasts more than one week.  You feel dizzy at times.  You feel like you are going to throw up (nauseous).  You throw up. Get help right away if:  You pass out.  You have to change pads every hour.  You have belly (abdominal) pain.  You have a fever.  You get sweaty.  You get weak.  You passing large blood clots from your vagina. Summary  Abnormal uterine bleeding means bleeding more than usual from your uterus.  There are many problems that may cause this. You should see a doctor for any kind of bleeding that is not normal.  Treatment depends on the cause of the bleeding. This information is not intended to replace advice given to you by your health care provider. Make sure you discuss any questions you have with your health care provider. Document Released: 11/30/2008 Document Revised: 01/28/2016 Document Reviewed: 01/28/2016 Elsevier Interactive Patient Education  2017 [employer].    Abdominal Hysterectomy Abdominal hysterectomy is a surgery to remove your womb (uterus). The womb is the part of your body that holds a  growing baby. You may need this procedure if:  You have cancer.  You have growths (tumors or fibroids) in your uterus.  You have long-term (chronic) pain.  You are bleeding.  Your womb has slipped down into your vagina.  You have a condition in which the tissue that lines the womb grows outside of its normal place.  You have an infection in your womb.  You have problems with your period.  You may also need other reproductive parts removed. This will depend on why you need to have the surgery. What happens before the procedure? Staying hydrated Follow instructions from your doctor about hydration. This may include:  Up to 2 hours before the procedure - you may continue to drink clear liquids, such as: ? Water. ? Fruit juice. ? Black coffee. ? Plain tea.  Eating and drinking restrictions Follow instructions from your doctor about eating and drinking. These may include:  8 hours before the procedure - stop eating heavy meals or foods, such as: ? Meat. ? Fried foods. ? Fatty foods.  6 hours before the procedure - stop eating light meals or foods, such as: ? Toast. ? Cereal.  6 hours before the procedure - stop drinking: ? Milk ? Drinks that have milk in them.  2 hours before the procedure - stop drinking clear liquids.  Medicines  Ask your doctor about: ? Changing or stopping your normal medicines. This is important if you take diabetes medicines or blood thinners. ? Taking medicines such as aspirin and ibuprofen.  These medicines can thin your blood. Do not take these medicines before your procedure if your doctor tells you not to.  You may be given antibiotic medicine. This can help prevent infection.  You may be asked to take medicines that help you poop (laxatives). General instructions  Ask your doctor how your surgical site will be marked or identified.  You may be asked to shower with a germ-killing soap.  Plan to have someone take you home from the  hospital.  Do not use any products that contain nicotine or tobacco, such as cigarettes and e-cigarettes. If you need help quitting, ask your doctor.  You may have an exam or tests done.  You may have a blood or urine sample taken.  You may need to have an enema to clean out your rectum and lower colon.  Talk to your doctor about the changes this procedure may cause. These can be physical or emotional. What happens during the procedure?  To lower your risk of infection: ? Your health care team will wash or clean their hands. ? Your skin will be washed with soap. ? Hair may be removed from the surgical area.  An IV tube will be put into one of your veins.  You will be given one or more of the following: ? A medicine to help you relax (sedative). ? A medicine to make you fall asleep (general anesthetic).  Tight-fitting (compression) stockings will be placed on your legs to help with circulation.  A thin, flexible tube (catheter) will be inserted to help drain your urine.  The doctor will make a cut (incision) through the skin in your lower belly. It may go side-to-side or up-and-down.  The doctor will move the body tissues that cover your womb.  The doctor will remove your womb.  The doctor may take out any other parts that need to be removed.  The doctor will control the bleeding.  The doctor will close your cut with stitches (sutures), skin glue, or adhesive strips.  A bandage (dressing) will be placed over the cut. The procedure may vary among doctors and hospitals. What happens after the procedure?  You will be given pain medicine if you need it.  Your blood pressure, heart rate, breathing rate, and blood oxygen level will be watched until the medicines you were given have worn off.  You will need to stay in the hospital to recover. Ask your doctor how long you will need to stay in the hospital after your procedure.  You may have a liquid diet at first. You will  most likely return to your usual diet the day after surgery.  You will still have the urinary catheter in place. It will likely be removed the day after surgery.  You may have to wear compression stockings. These stockings help to prevent blood clots and reduce swelling in your legs.  You will be encouraged to walk as soon as possible. You will also use a device or do breathing exercises to keep your lungs clear.  You may need to use a sanitary pad for vaginal discharge. Summary  Abdominal hysterectomy is a surgery to remove your womb (uterus). The womb is the part of your body that holds a growing baby.  Talk to your doctor about the changes this procedure may cause. These can be physical or emotional.  You will be given pain medicine if you need it.  You will need to stay in the hospital to recover for one to  two days. Ask your doctor how long you will need to stay in the hospital after your procedure. This information is not intended to replace advice given to you by your health care provider. Make sure you discuss any questions you have with your health care provider. Document Released: 02/07/2013 Document Revised: 01/22/2016 Document Reviewed: 01/22/2016 Elsevier Interactive Patient Education  2017 [employer].

## 2016-11-20 NOTE — H&P (Signed)
Attestation signed by Aletha Halim, MD at 11/19/2016 10:25 PM  Attestation of Attending Supervision of Fellow: Evaluation and management procedures were performed by the fellow under my supervision.  I have seen and examined the patient,  reviewed the fellow's note and chart, and I agree with the management and plan. No current PCP. Pt states LMP mid may before bleeding started mid September and has continued daily; no pain. +night sweats since 01/2016, no hot flashes or vaginal dryness. D/w pt likely will do lupron on discharge to allow for outpatient ultimate plan (I told her likely would recommend hyst) and for blood counts to come back up. NPO in case needs d&c for IV medication failure.   Durene Romans MD [facility]           [] Hide copied text [] Hover for attribution information  History   CSN: [id number] Arrival date and time: 11/19/16 7169  First Provider Initiated Contact with Patient 11/19/16 2023      Chief Complaint  Patient presents with  . Vaginal Bleeding    HPI: MARCHETA HORSEY is a 51 y.o. G46P2002 female with history of fibroids who present to [facility] with c/c of heavy vaginal bleeding. She reports that she had had her menses since May, then it started on 11/01/16 and has persisted. For the last 4 days menses has gotten a lot heavier, and passing some clots. She reports that she "fills up a tampon" if she sneezes.  For the last couple of days she has been feeling dizzy and mildly nauseous. Denies any SOB, DOE or chest, fever, chills, or any other concerns. She is otherwise healthy, other than history of HTN (has not been taking BP med d/t cost). Denies h/o DVT/PE, CVA, MI, or smoking.   Past obstetric history:                 OB History  Gravida Para Term Preterm AB Living  2 2 2     2   SAB TAB Ectopic Multiple Live Births               # Outcome Date GA Lbr Len/2nd Weight Sex Delivery Anes PTL Lv    2 Term           1 Term                   Past Medical History:  Diagnosis Date  . Hypertension          Past Surgical History:  Procedure Laterality Date  . CESAREAN SECTION     2 previous  . TUBAL LIGATION           Family History  Problem Relation Age of Onset  . Hypertension Mother   . Cancer Maternal Aunt        breast        Social History  Substance Use Topics  . Smoking status: Never Smoker  . Smokeless tobacco: Never Used  . Alcohol use No    Allergies: No Known Allergies         Prescriptions Prior to Admission  Medication Sig Dispense Refill Last Dose  . Acetaminophen-Caff-Pyrilamine (MIDOL COMPLETE) 500-60-15 MG TABS Take 2 tablets by mouth once. For pain.   11/19/2016 at Unknown time  . Naproxen Sodium (ALEVE PO) Take by mouth.   11/18/2016 at Unknown time  . cephALEXin (KEFLEX) 500 MG capsule Take 1 capsule (500 mg total) by mouth 4 (four) times  daily. 28 capsule 0   . olmesartan (BENICAR) 20 MG tablet Take 20 mg by mouth daily.   Past Week at Unknown  . traMADol (ULTRAM) 50 MG tablet Take 1 tablet (50 mg total) by mouth every 6 (six) hours as needed for pain. 30 tablet 0     Review of Systems - Negative except for what is mentioned in HPI.  Physical Exam   Blood pressure (!) 148/82, pulse 99, temperature 98.3 F (36.8 C), temperature source Oral, resp. rate 16, height 5' (1.524 m), weight 108 lb (49 kg).  Constitutional: Thing female in no acute distress.  HENT: /AT, normal oropharynx mucosa. MMM Eyes: normal conjunctivae, no scleral icterus Cardiovascular: normal rate, regular rhythm Respiratory: normal effort, lungs CTAB.  GI: Abd soft, non-tender, uterus palpable to just below the umbilicus  Pelvic: NEFG, moderate amount of blood in vaginal vault with small clots, cervix normal appearing. Uterus ~18-week size on bimanual exam with palpable fibroids.  MSK: Extremities nontender, no  edema, normal ROM Neurologic: Alert and oriented x 4. Psych: Normal mood and affect Skin: warm and dry  MAU Course  Procedures  MDM. Patient seen and examined as above. Hemodynamically stable. Pelvic exam as above, cultures collected. CBC ordered.  U/S from 2013 reviewed - showed multiple fibroids.  CBC: Hgb 5.7.  Discussed with Dr. Ilda Basset need for admission for blood transfusion. Will manage bleeding with IV Lysteda.  Assessment and Plan  Assessment: 1. Acute blood loss anemia   2. Abnormal uterine bleeding (AUB)   3. Uterine leiomyoma, unspecified location     Plan: --Admit to [facility] --4 units of PRBCs ordered --Lysteda 500 mg q8h --Check coag studies and TSH --Gyn U/S ordered --NPO for now  Degele, Jenne Pane, MD 11/19/2016 8:51 PM

## 2016-11-21 LAB — TYPE AND SCREEN
ABO/RH(D): B POS
Antibody Screen: NEGATIVE
Unit division: 0
Unit division: 0
Unit division: 0
Unit division: 0

## 2016-11-21 LAB — BPAM RBC
BLOOD PRODUCT EXPIRATION DATE: 201810162359
BLOOD PRODUCT EXPIRATION DATE: 201810162359
Blood Product Expiration Date: 201810172359
Blood Product Expiration Date: 201810252359
ISSUE DATE / TIME: 201810050208
ISSUE DATE / TIME: 201810050627
ISSUE DATE / TIME: 201810042349
ISSUE DATE / TIME: 201810050409
UNIT TYPE AND RH: 5100
UNIT TYPE AND RH: 5100
Unit Type and Rh: 5100
Unit Type and Rh: 5100

## 2016-11-25 ENCOUNTER — Telehealth: Payer: Self-pay | Admitting: General Practice

## 2016-11-25 NOTE — Telephone Encounter (Signed)
Patient called and left message stating she isn't sure when she is supposed to start the medication, before or after the procedure. Called patient and she states she is unsure if she is supposed to be taking the lysteda now or if she's supposed to wait. Patient states she has been taking it for 4 days already. Patient states her bleeding has slowed down to a little more than spotting. Discussed with Dr Harolyn Rutherford who states to continue medication through tomorrow and to start again when bleeding occurs. Discussed with patient. Patient verbalized understanding and asked if she should take pain medication before biopsy appt and when. Told patient she may take the ibuprofen 1 hour before if she would like. Patient verbalized understanding & had no questions

## 2016-12-09 ENCOUNTER — Encounter: Payer: Self-pay | Admitting: Obstetrics & Gynecology

## 2016-12-09 ENCOUNTER — Encounter (HOSPITAL_COMMUNITY): Payer: Self-pay

## 2016-12-09 ENCOUNTER — Ambulatory Visit (INDEPENDENT_AMBULATORY_CARE_PROVIDER_SITE_OTHER): Payer: BLUE CROSS/BLUE SHIELD | Admitting: Obstetrics & Gynecology

## 2016-12-09 ENCOUNTER — Other Ambulatory Visit: Payer: Self-pay | Admitting: Obstetrics & Gynecology

## 2016-12-09 ENCOUNTER — Other Ambulatory Visit (HOSPITAL_COMMUNITY)
Admission: RE | Admit: 2016-12-09 | Discharge: 2016-12-09 | Disposition: A | Discharge status: Home / Self Care | Payer: BLUE CROSS/BLUE SHIELD | Source: Ambulatory Visit | Attending: Obstetrics & Gynecology | Admitting: Obstetrics & Gynecology

## 2016-12-09 VITALS — BP 160/92 | HR 66 | Wt 105.3 lb

## 2016-12-09 DIAGNOSIS — Z0000 Encounter for general adult medical examination without abnormal findings: Secondary | ICD-10-CM

## 2016-12-09 DIAGNOSIS — N938 Other specified abnormal uterine and vaginal bleeding: Secondary | ICD-10-CM | POA: Insufficient documentation

## 2016-12-09 DIAGNOSIS — Z124 Encounter for screening for malignant neoplasm of cervix: Secondary | ICD-10-CM

## 2016-12-09 DIAGNOSIS — Z3202 Encounter for pregnancy test, result negative: Secondary | ICD-10-CM | POA: Diagnosis not present

## 2016-12-09 DIAGNOSIS — Z209 Contact with and (suspected) exposure to unspecified communicable disease: Secondary | ICD-10-CM

## 2016-12-09 DIAGNOSIS — Z1231 Encounter for screening mammogram for malignant neoplasm of breast: Secondary | ICD-10-CM

## 2016-12-09 LAB — POCT PREGNANCY, URINE: PREG TEST UR: NEGATIVE

## 2016-12-09 NOTE — Progress Notes (Signed)
Mammogram scheduled.

## 2016-12-09 NOTE — Progress Notes (Signed)
Mammogram scheduled Nov 20th @ 0840.  Pt notified.

## 2016-12-09 NOTE — Progress Notes (Signed)
   Subjective:    Patient ID: Benard Rink, female    DOB: 01-24-1966, 51 y.o.   MRN: [id number]  HPI 51 yo M AA P2 (6 and 48 yo kids) here today as a follow up after an admission at [facility] for menorrhagia, treated with 4 units of PRBCs. U/s showed large fibroids.   Review of Systems She previously took BP meds but stopped them "years ago". Last pap 2013 Last mammogram 2013    Objective:   Physical Exam Breathing, conversing, and ambulating normally Well nourished, well hydrated Black female, no apparent distress Abd- benign, uterus palpable to about U-2 Cervix appears normal       Assessment & Plan:  HTN- refer to fam med Pap and mammogram ordered [facility] done today Message sent to [place] to schedule her for a TAH/BSO/possible appendectomy

## 2016-12-11 ENCOUNTER — Telehealth: Payer: Self-pay | Admitting: General Practice

## 2016-12-11 NOTE — Telephone Encounter (Signed)
Patient called and left message stating [facility] isn't taking new patients until January. Patient states she has called and made a new patient appt with [facility] for 10/31 and wants to make sure this is okay for the referral, please call back. Called patient, no answer- left message stating we are trying to reach you to return your phone call. We have received your voicemail message and yes that referral is fine. You may call us back if you have questions

## 2016-12-14 LAB — CYTOLOGY - PAP
Diagnosis: NEGATIVE
HPV: NOT DETECTED

## 2017-01-05 ENCOUNTER — Ambulatory Visit
Admission: RE | Admit: 2017-01-05 | Discharge: 2017-01-05 | Disposition: A | Discharge status: Home / Self Care | Payer: BLUE CROSS/BLUE SHIELD | Source: Ambulatory Visit | Attending: Obstetrics & Gynecology | Admitting: Obstetrics & Gynecology

## 2017-01-05 DIAGNOSIS — Z1231 Encounter for screening mammogram for malignant neoplasm of breast: Secondary | ICD-10-CM

## 2017-01-11 ENCOUNTER — Telehealth: Payer: Self-pay | Admitting: General Practice

## 2017-01-11 NOTE — Telephone Encounter (Signed)
[employer] called and left message stating they need information to process patient's disability claim including patient's diagnosis, treatment dates, surgery/therapy and return to work date. Per chart review, patient has not completed ROI or dropped off FMLA forms. Called patient and discussed that in order for [place] to call [employer] or fill out FMLA forms, she needs to sign a release of information in our office as well as a $15 payment fee for completion. Patient verbalized understanding and states she will come by during office hours & will have them fax the forms to [place]. Patient had no questions

## 2017-01-22 NOTE — Patient Instructions (Signed)
Your procedure is scheduled on: Thursday December 20, at 9:40 am  Enter through the Main Entrance of [facility] at: 8:15 am  Pick up the phone at the desk and dial [phone].  Call this number if you have problems the morning of surgery: [phone].  Remember: Do NOT eat food or drink any liquids after: Midnight on Wednesday December 19  Take these medicines the morning of surgery with a SIP OF WATER:  Do NOT wear jewelry (body piercing), metal hair clips/bobby pins, make-up, or nail polish. Do NOT wear lotions, powders, or perfumes.  You may wear deoderant. Do NOT shave for 48 hours prior to surgery. Do NOT bring valuables to the hospital. Contacts, dentures, or bridgework may not be worn into surgery. Leave suitcase in car.  After surgery it may be brought to your room.   For patients admitted to the hospital, checkout time is 11:00 AM the day of discharge.

## 2017-01-25 ENCOUNTER — Encounter (HOSPITAL_COMMUNITY)
Admission: RE | Admit: 2017-01-25 | Discharge: 2017-01-25 | Disposition: A | Discharge status: Home / Self Care | Payer: BLUE CROSS/BLUE SHIELD | Source: Ambulatory Visit | Attending: Obstetrics & Gynecology | Admitting: Obstetrics & Gynecology

## 2017-01-25 NOTE — Patient Instructions (Addendum)
Your procedure is scheduled on: Thursday February 04, 2017 at 9:40 am  Enter through the [employer] of [facility] at: 8:15 am  Pick up the phone at the desk and dial [phone].  Call this number if you have problems the morning of surgery: [phone].  Remember: Do NOT eat food or drink any liquids: after Midnight on Wednesday December 19  Take these medicines the morning of surgery with a SIP OF WATER: Amlodipine  STOP ALL VITAMINS AND SUPPLEMENT 1 WEEK PRIOR TO SURGERY  DO NOT SMOKE DAY OF SURGEY  Do NOT wear jewelry (body piercing), metal hair clips/bobby pins, make-up, artificial eye lashes or nail polish. Do NOT wear lotions, powders, or perfumes.  You may wear deoderant. Do NOT shave for 48 hours prior to surgery. Do NOT bring valuables to the hospital. Contacts, dentures, or bridgework may not be worn into surgery.  Leave suitcase in car.  After surgery it may be brought to your room.    For patients admitted to the hospital, checkout time is 11:00 AM the day of discharge.

## 2017-01-29 ENCOUNTER — Other Ambulatory Visit: Payer: Self-pay

## 2017-01-29 ENCOUNTER — Encounter (HOSPITAL_COMMUNITY)
Admission: RE | Admit: 2017-01-29 | Discharge: 2017-01-29 | Disposition: A | Discharge status: Home / Self Care | Payer: BLUE CROSS/BLUE SHIELD | Source: Ambulatory Visit | Attending: Obstetrics & Gynecology | Admitting: Obstetrics & Gynecology

## 2017-01-29 ENCOUNTER — Encounter (HOSPITAL_COMMUNITY): Payer: Self-pay

## 2017-01-29 DIAGNOSIS — Z01812 Encounter for preprocedural laboratory examination: Secondary | ICD-10-CM | POA: Diagnosis not present

## 2017-01-29 DIAGNOSIS — D259 Leiomyoma of uterus, unspecified: Secondary | ICD-10-CM | POA: Diagnosis not present

## 2017-01-29 HISTORY — DX: Anemia, unspecified: D64.9

## 2017-01-29 HISTORY — DX: Unspecified injury of right eye and orbit, initial encounter: S05.91XA

## 2017-01-29 LAB — CBC
HCT: 40.9 % (ref 36.0–46.0)
HEMOGLOBIN: 13 g/dL (ref 12.0–15.0)
MCH: 29.2 pg (ref 26.0–34.0)
MCHC: 31.8 g/dL (ref 30.0–36.0)
MCV: 91.9 fL (ref 78.0–100.0)
Platelets: 197 10*3/uL (ref 150–400)
RBC: 4.45 MIL/uL (ref 3.87–5.11)
RDW: 19.6 % — ABNORMAL HIGH (ref 11.5–15.5)
WBC: 10.7 10*3/uL — ABNORMAL HIGH (ref 4.0–10.5)

## 2017-01-29 LAB — BASIC METABOLIC PANEL
ANION GAP: 9 (ref 5–15)
BUN: 25 mg/dL — ABNORMAL HIGH (ref 6–20)
CHLORIDE: 104 mmol/L (ref 101–111)
CO2: 23 mmol/L (ref 22–32)
Calcium: 10.5 mg/dL — ABNORMAL HIGH (ref 8.9–10.3)
Creatinine, Ser: 0.71 mg/dL (ref 0.44–1.00)
GFR calc non Af Amer: >60 mL/min (ref >60)
GLUCOSE: 91 mg/dL (ref 65–99)
Potassium: 4.1 mmol/L (ref 3.5–5.1)
Sodium: 136 mmol/L (ref 135–145)

## 2017-01-29 LAB — TYPE AND SCREEN
ABO/RH(D): B POS
ANTIBODY SCREEN: NEGATIVE

## 2017-01-29 NOTE — Pre-Procedure Instructions (Signed)
Dr. Candida Peeling viewed and okay 'd EKG

## 2017-02-04 ENCOUNTER — Inpatient Hospital Stay (HOSPITAL_COMMUNITY): Payer: BLUE CROSS/BLUE SHIELD | Admitting: Anesthesiology

## 2017-02-04 ENCOUNTER — Other Ambulatory Visit: Payer: Self-pay

## 2017-02-04 ENCOUNTER — Encounter (HOSPITAL_COMMUNITY)
Admission: RE | Disposition: A | Discharge status: Home / Self Care | Payer: Self-pay | Source: Ambulatory Visit | Attending: Obstetrics & Gynecology

## 2017-02-04 ENCOUNTER — Encounter (HOSPITAL_COMMUNITY): Payer: Self-pay | Admitting: *Deleted

## 2017-02-04 ENCOUNTER — Inpatient Hospital Stay (HOSPITAL_COMMUNITY)
Admission: RE | Admit: 2017-02-04 | Discharge: 2017-02-06 | DRG: 743 | Disposition: A | Discharge status: Home / Self Care | Payer: BLUE CROSS/BLUE SHIELD | Source: Ambulatory Visit | Attending: Obstetrics & Gynecology | Admitting: Obstetrics & Gynecology

## 2017-02-04 DIAGNOSIS — Z23 Encounter for immunization: Secondary | ICD-10-CM | POA: Diagnosis not present

## 2017-02-04 DIAGNOSIS — Z9889 Other specified postprocedural states: Secondary | ICD-10-CM

## 2017-02-04 DIAGNOSIS — N920 Excessive and frequent menstruation with regular cycle: Secondary | ICD-10-CM | POA: Diagnosis present

## 2017-02-04 DIAGNOSIS — D259 Leiomyoma of uterus, unspecified: Principal | ICD-10-CM | POA: Diagnosis present

## 2017-02-04 HISTORY — DX: Other specified postprocedural states: Z98.890

## 2017-02-04 HISTORY — DX: Presence of artificial eye: Z97.0

## 2017-02-04 HISTORY — DX: Personal history of other medical treatment: Z92.89

## 2017-02-04 LAB — TYPE AND SCREEN
ABO/RH(D): B POS
ANTIBODY SCREEN: NEGATIVE

## 2017-02-04 LAB — PREGNANCY, URINE: Preg Test, Ur: NEGATIVE

## 2017-02-04 SURGERY — HYSTERECTOMY, ABDOMINAL, WITH SALPINGO-OOPHORECTOMY
Anesthesia: General

## 2017-02-04 MED ORDER — DEXAMETHASONE SODIUM PHOSPHATE 10 MG/ML IJ SOLN
INTRAMUSCULAR | Status: DC | PRN
  Administered 2017-02-04: 4 mg via INTRAVENOUS

## 2017-02-04 MED ORDER — BUPIVACAINE HCL (PF) 0.5 % IJ SOLN
INTRAMUSCULAR | Status: AC
  Filled 2017-02-04: qty 30

## 2017-02-04 MED ORDER — MEPERIDINE HCL 25 MG/ML IJ SOLN: 6.2500 mg | INTRAMUSCULAR | Status: DC | PRN

## 2017-02-04 MED ORDER — LIDOCAINE HCL (CARDIAC) 20 MG/ML IV SOLN
INTRAVENOUS | Status: AC
  Filled 2017-02-04: qty 5

## 2017-02-04 MED ORDER — LIDOCAINE HCL (CARDIAC) 20 MG/ML IV SOLN
INTRAVENOUS | Status: DC | PRN
  Administered 2017-02-04: 40 mg via INTRAVENOUS

## 2017-02-04 MED ORDER — SCOPOLAMINE 1 MG/3DAYS TD PT72
MEDICATED_PATCH | TRANSDERMAL | Status: AC
  Filled 2017-02-04: qty 1

## 2017-02-04 MED ORDER — LACTATED RINGERS IV SOLN
INTRAVENOUS | Status: DC
  Administered 2017-02-04 (×2): via INTRAVENOUS

## 2017-02-04 MED ORDER — FENTANYL CITRATE (PF) 100 MCG/2ML IJ SOLN
INTRAMUSCULAR | Status: DC | PRN
  Administered 2017-02-04 (×4): 50 ug via INTRAVENOUS

## 2017-02-04 MED ORDER — ONDANSETRON HCL 4 MG/2ML IJ SOLN
INTRAMUSCULAR | Status: AC
  Filled 2017-02-04: qty 2

## 2017-02-04 MED ORDER — MIDAZOLAM HCL 2 MG/2ML IJ SOLN
INTRAMUSCULAR | Status: AC
  Filled 2017-02-04: qty 2

## 2017-02-04 MED ORDER — PROPOFOL 10 MG/ML IV BOLUS
INTRAVENOUS | Status: DC | PRN
  Administered 2017-02-04: 120 mg via INTRAVENOUS

## 2017-02-04 MED ORDER — INFLUENZA VAC SPLIT QUAD 0.5 ML IM SUSY
0.5000 mL | PREFILLED_SYRINGE | INTRAMUSCULAR | Status: AC
  Administered 2017-02-05: 0.5 mL via INTRAMUSCULAR
  Filled 2017-02-04: qty 0.5

## 2017-02-04 MED ORDER — FENTANYL CITRATE (PF) 100 MCG/2ML IJ SOLN
INTRAMUSCULAR | Status: AC
  Administered 2017-02-04: 50 ug via INTRAVENOUS
  Filled 2017-02-04: qty 2

## 2017-02-04 MED ORDER — ONDANSETRON HCL 4 MG/2ML IJ SOLN
INTRAMUSCULAR | Status: DC | PRN
  Administered 2017-02-04: 4 mg via INTRAVENOUS

## 2017-02-04 MED ORDER — CEFAZOLIN SODIUM-DEXTROSE 2-3 GM-%(50ML) IV SOLR
INTRAVENOUS | Status: DC | PRN
  Administered 2017-02-04: 2 g via INTRAVENOUS

## 2017-02-04 MED ORDER — MIDAZOLAM HCL 2 MG/2ML IJ SOLN
INTRAMUSCULAR | Status: DC | PRN
  Administered 2017-02-04: 1 mg via INTRAVENOUS

## 2017-02-04 MED ORDER — ONDANSETRON HCL 4 MG/2ML IJ SOLN
4.0000 mg | Freq: Four times a day (QID) | INTRAMUSCULAR | Status: DC | PRN
  Administered 2017-02-04 (×2): 4 mg via INTRAVENOUS
  Filled 2017-02-04 (×2): qty 2

## 2017-02-04 MED ORDER — OXYCODONE-ACETAMINOPHEN 5-325 MG PO TABS
1.0000 | ORAL_TABLET | ORAL | Status: DC | PRN
  Administered 2017-02-05: 1 via ORAL
  Filled 2017-02-04: qty 1

## 2017-02-04 MED ORDER — METOCLOPRAMIDE HCL 5 MG/ML IJ SOLN: 10.0000 mg | Freq: Once | INTRAMUSCULAR | Status: DC | PRN

## 2017-02-04 MED ORDER — ROCURONIUM BROMIDE 100 MG/10ML IV SOLN
INTRAVENOUS | Status: DC | PRN
  Administered 2017-02-04: 50 mg via INTRAVENOUS

## 2017-02-04 MED ORDER — HYDROMORPHONE HCL 1 MG/ML IJ SOLN
0.2000 mg | INTRAMUSCULAR | Status: DC | PRN
  Administered 2017-02-04 – 2017-02-05 (×5): 0.6 mg via INTRAVENOUS
  Filled 2017-02-04 (×5): qty 1

## 2017-02-04 MED ORDER — DEXAMETHASONE SODIUM PHOSPHATE 4 MG/ML IJ SOLN
INTRAMUSCULAR | Status: AC
  Filled 2017-02-04: qty 1

## 2017-02-04 MED ORDER — BUPIVACAINE HCL (PF) 0.5 % IJ SOLN
INTRAMUSCULAR | Status: DC | PRN
  Administered 2017-02-04: 30 mL

## 2017-02-04 MED ORDER — SUGAMMADEX SODIUM 200 MG/2ML IV SOLN
INTRAVENOUS | Status: AC
  Filled 2017-02-04: qty 2

## 2017-02-04 MED ORDER — ROCURONIUM BROMIDE 100 MG/10ML IV SOLN
INTRAVENOUS | Status: AC
  Filled 2017-02-04: qty 1

## 2017-02-04 MED ORDER — CEFAZOLIN SODIUM-DEXTROSE 2-3 GM-%(50ML) IV SOLR
INTRAVENOUS | Status: AC
  Filled 2017-02-04: qty 50

## 2017-02-04 MED ORDER — PROPOFOL 10 MG/ML IV BOLUS
INTRAVENOUS | Status: AC
  Filled 2017-02-04: qty 20

## 2017-02-04 MED ORDER — FENTANYL CITRATE (PF) 250 MCG/5ML IJ SOLN
INTRAMUSCULAR | Status: AC
  Filled 2017-02-04: qty 5

## 2017-02-04 MED ORDER — LACTATED RINGERS IV SOLN
INTRAVENOUS | Status: DC
  Administered 2017-02-04: 13:00:00 via INTRAVENOUS

## 2017-02-04 MED ORDER — LACTATED RINGERS IV SOLN: INTRAVENOUS | Status: DC

## 2017-02-04 MED ORDER — ONDANSETRON HCL 4 MG PO TABS
4.0000 mg | ORAL_TABLET | Freq: Four times a day (QID) | ORAL | Status: DC | PRN
  Administered 2017-02-05: 4 mg via ORAL
  Filled 2017-02-04: qty 1

## 2017-02-04 MED ORDER — SUGAMMADEX SODIUM 200 MG/2ML IV SOLN
INTRAVENOUS | Status: DC | PRN
  Administered 2017-02-04: 106.4 mg via INTRAVENOUS

## 2017-02-04 MED ORDER — SCOPOLAMINE 1 MG/3DAYS TD PT72
1.0000 | MEDICATED_PATCH | Freq: Once | TRANSDERMAL | Status: DC
  Administered 2017-02-04: 1.5 mg via TRANSDERMAL

## 2017-02-04 MED ORDER — CEFAZOLIN SODIUM-DEXTROSE 2-4 GM/100ML-% IV SOLN: 2.0000 g | INTRAVENOUS | Status: DC

## 2017-02-04 MED ORDER — KETOROLAC TROMETHAMINE 30 MG/ML IJ SOLN
INTRAMUSCULAR | Status: DC | PRN
  Administered 2017-02-04: 30 mg via INTRAVENOUS

## 2017-02-04 MED ORDER — FENTANYL CITRATE (PF) 100 MCG/2ML IJ SOLN
25.0000 ug | INTRAMUSCULAR | Status: DC | PRN
  Administered 2017-02-04 (×2): 50 ug via INTRAVENOUS

## 2017-02-04 MED ORDER — KETOROLAC TROMETHAMINE 30 MG/ML IJ SOLN
INTRAMUSCULAR | Status: AC
  Filled 2017-02-04: qty 1

## 2017-02-04 SURGICAL SUPPLY — 32 items
CANISTER SUCT 3000ML PPV (MISCELLANEOUS) ×4 IMPLANT
CLOSURE WOUND 1/2 X4 (GAUZE/BANDAGES/DRESSINGS) ×1
CLOTH BEACON ORANGE TIMEOUT ST (SAFETY) ×4 IMPLANT
CONT PATH 16OZ SNAP LID 3702 (MISCELLANEOUS) ×4 IMPLANT
DECANTER SPIKE VIAL GLASS SM (MISCELLANEOUS) IMPLANT
DRAPE CESAREAN BIRTH W POUCH (DRAPES) ×4 IMPLANT
DRAPE WARM FLUID 44X44 (DRAPE) IMPLANT
DRSG OPSITE POSTOP 4X10 (GAUZE/BANDAGES/DRESSINGS) ×4 IMPLANT
DURAPREP 26ML APPLICATOR (WOUND CARE) ×4 IMPLANT
GAUZE SPONGE 4X4 16PLY XRAY LF (GAUZE/BANDAGES/DRESSINGS) ×4 IMPLANT
GLOVE BIO SURGEON STRL SZ 6.5 (GLOVE) ×3 IMPLANT
GLOVE BIO SURGEONS STRL SZ 6.5 (GLOVE) ×1
GLOVE BIOGEL PI IND STRL 7.0 (GLOVE) ×4 IMPLANT
GLOVE BIOGEL PI INDICATOR 7.0 (GLOVE) ×4
GOWN STRL REUS W/TWL LRG LVL3 (GOWN DISPOSABLE) ×12 IMPLANT
HEMOSTAT ARISTA ABSORB 3G PWDR (MISCELLANEOUS) IMPLANT
NEEDLE SPNL 18GX3.5 QUINCKE PK (NEEDLE) ×4 IMPLANT
NS IRRIG 1000ML POUR BTL (IV SOLUTION) ×4 IMPLANT
PACK ABDOMINAL GYN (CUSTOM PROCEDURE TRAY) ×4 IMPLANT
PAD OB MATERNITY 4.3X12.25 (PERSONAL CARE ITEMS) ×4 IMPLANT
PROTECTOR NERVE ULNAR (MISCELLANEOUS) ×4 IMPLANT
SPONGE LAP 18X18 X RAY DECT (DISPOSABLE) ×8 IMPLANT
STRIP CLOSURE SKIN 1/2X4 (GAUZE/BANDAGES/DRESSINGS) ×3 IMPLANT
SUT CHROMIC 3 0 SH 27 (SUTURE) IMPLANT
SUT PDS AB 0 CTX 60 (SUTURE) ×4 IMPLANT
SUT VIC AB 0 CT1 36 (SUTURE) ×8 IMPLANT
SUT VIC AB 2-0 CT1 18 (SUTURE) ×12 IMPLANT
SUT VIC AB 3-0 CT1 27 (SUTURE) ×2
SUT VIC AB 3-0 CT1 TAPERPNT 27 (SUTURE) ×2 IMPLANT
SYR 30ML LL (SYRINGE) ×4 IMPLANT
TOWEL OR 17X24 6PK STRL BLUE (TOWEL DISPOSABLE) ×8 IMPLANT
TRAY FOLEY CATH SILVER 14FR (SET/KITS/TRAYS/PACK) ×4 IMPLANT

## 2017-02-04 NOTE — Anesthesia Procedure Notes (Signed)
Procedure Name: Intubation Date/Time: 02/04/2017 9:32 AM Performed by: Montez Hageman, MD Pre-anesthesia Checklist: Patient identified, Emergency Drugs available, Suction available and Patient being monitored Patient Re-evaluated:Patient Re-evaluated prior to induction Oxygen Delivery Method: Circle system utilized Preoxygenation: Pre-oxygenation with 100% oxygen Induction Type: IV induction Ventilation: Mask ventilation without difficulty Laryngoscope Size: Mac and 3 Grade View: Grade II Tube type: Oral Tube size: 7.0 mm Number of attempts: 1 Airway Equipment and Method: Stylet Placement Confirmation: ETT inserted through vocal cords under direct vision,  positive ETCO2,  breath sounds checked- equal and bilateral and CO2 detector Secured at: 22 cm Tube secured with: Tape Dental Injury: Teeth and Oropharynx as per pre-operative assessment

## 2017-02-04 NOTE — Op Note (Signed)
02/04/2017  10:20 AM  PATIENT:  [name]  51 y.o. female  PRE-OPERATIVE DIAGNOSIS:  Menorrhagia, fibroids  POST-OPERATIVE DIAGNOSIS:  Menorrhagia, fibroids  PROCEDURE:  Procedure(s): HYSTERECTOMY ABDOMINAL WITH SALPINGO-OOPHORECTOMY (Bilateral)  SURGEON:  Surgeon(s) and Role:    * Amely Voorheis, Wilhemina Cash, MD - Primary    * Constant, Peggy, MD - Assisting  ANESTHESIA:   local and general  EBL:  150 mL   BLOOD ADMINISTERED:none  DRAINS: none   LOCAL MEDICATIONS USED:  MARCAINE     SPECIMEN:  Source of Specimen:  uterus, tubes, and ovaries  DISPOSITION OF SPECIMEN:  PATHOLOGY  COUNTS:  YES  TOURNIQUET:  * No tourniquets in log *  DICTATION: .Dragon Dictation  PLAN OF CARE: Admit to inpatient   PATIENT DISPOSITION:  PACU - hemodynamically stable.   Delay start of Pharmacological VTE agent (>24hrs) due to surgical blood loss or risk of bleeding: not applicable     The risks, benefits, and alternatives of surgery were explained, understood, and accepted. Consents were signed. All questions were answered. She was taken to the operating room and general anesthesia was applied without complication. Her abdomen and vagina were prepped and draped in the usual sterile fashion. A Foley catheter was placed which drained clear urine throughout the case. A transverse incision was made approximately 2 cm above her symphysis pubis after injecting 30 mL of 0.5% marcaine in the subcutaneous tissue. The incision was carried down through the subcutaneous tissue to the fascia. Bleeding encountered was cauterized with the Bovie. The fascia was scored the midline and the fascial incision was extended bilaterally. The pyramidalis muscles were separated in a transverse fashion using electrosurgical technique. Approximately 2 cm of the rectus muscles were separated in a transverse fashion in the midline using electrosurgical technique. Hemostasis was maintained. The peritoneum was entered with  hemostats and the peritoneal incision was extended bilaterally with the Bovie, taking care to avoid bowel and bladder. The patient was placed in Trendelenburg position and her bowel was packed out of the abdominal cavity. The pelvis was inspected. Her very large uterus filled the entire pelvis. I  elevated the uterus out of the incision. Coker clamps were used to elevate the uterus. The round ligaments were identified clamped cut and ligated. A bladder flap was created anteriorly and the bladder was pushed out of the operative site with a moist lap sponge. The uteroovarian ligaments were identified bilaterally. They were clamped, cut, and ligated. Excellent hemostasis was noted. 2-0 Vicryl sutures used throughout this case unless otherwise specified. The uterine vessels were skeletonized, clamped, cut, and doubly ligated. A bladder flap was created anteriorly. The remainder of the cervix was separated from its pelvic attachments using the same clamp, cut, ligate technique. Curved Heaney clamps were used to clamp beneath the cervix. The cervix and uterus were removed and sent to pathology. The vaginal cuff was noted to be hemostatic after placing 2 figure of eight sutures.  All pedicles were noted to be hemostatic.  The ureters were noted to be functioning and of normal caliber. The sponges were removed from the pelvis. The rectus muscles were inspected and hemostasis was assured. The appendix appeared normal.The mesoappendix was very short and dense. Because of the short mesoappendix, the appendix was very close to the large bowel and I elected not to remove it.  The fascia was closed with a 0 Vicryl running nonlocking suture. The subcutaneous tissue was irrigated, clean, dry.  A subcuticular closure was done with 3-0 vicryl  suture. She tolerated the procedure well and was taken to the recovery room in stable condition. Her Foley catheter drained clear urine throughout.

## 2017-02-04 NOTE — Transfer of Care (Signed)
Immediate Anesthesia Transfer of Care Note  Patient: [name]  Procedure(s) Performed: HYSTERECTOMY ABDOMINAL WITH SALPINGO-OOPHORECTOMY (Bilateral )  Patient Location: PACU  Anesthesia Type:General  Level of Consciousness: awake, alert , oriented and patient cooperative  Airway & Oxygen Therapy: Patient Spontanous Breathing and Patient connected to nasal cannula oxygen  Post-op Assessment: Report given to RN and Post -op Vital signs reviewed and stable  Post vital signs: Reviewed and stable  Last Vitals:  Vitals:   02/04/17 0732  BP: (!) 142/97  Pulse: 83  Resp: 16  Temp: 37.1 C  SpO2: 100%    Last Pain:  Vitals:   02/04/17 0732  TempSrc: Oral      Patients Stated Pain Goal: 3 (38/88/28 0034)  Complications: No apparent anesthesia complications

## 2017-02-04 NOTE — H&P (Addendum)
[name] is an 51 y.o. female. She is married and has 2 kids. She is here today for a TAH/BSO/appendectomy. She has large fibroids that are causing her pain. She also required a transfusion after an admission to [facility] for heavy bleeding. Her pap smear and endometrial biopsy were normal. She already has hot flashes.  No LMP recorded (approximate). Patient is perimenopausal.    Past Medical History:  Diagnosis Date  . Anemia   . History of blood transfusion 11/19/2016   4 units at [facility]  . Hypertension   . HYPERTENSION 01/06/2008   Qualifier: Diagnosis of  By: Ronnald Ramp MD, Arvid Right.   . Presence of artificial left eye   . 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 CESAREAN SECTION     2 previous  . TRANFUSION  11/4016   RECEIVED 4 UNITS OF BLOOD   . TUBAL LIGATION      Family History  Problem Relation Age of Onset  . Hypertension Mother   . Cancer Maternal Aunt        breast    Social History:  reports that  has never smoked. she has never used smokeless tobacco. She reports that she does not drink alcohol or use drugs.  Allergies: No Known Allergies  Medications Prior to Admission  Medication Sig Dispense Refill Last Dose  . amLODipine (NORVASC) 10 MG tablet Take 10 mg by mouth daily.   02/04/2017 at 0545  . ferrous sulfate (FERROUSUL) 325 (65 FE) MG tablet Take 1 tablet (325 mg total) by mouth 2 (two) times daily with a meal. 60 tablet 2 02/03/2017 at Unknown time  . ibuprofen (ADVIL,MOTRIN) 800 MG tablet Take 1 tablet (800 mg total) by mouth 3 (three) times daily with meals as needed for headache or moderate pain. 30 tablet 3 Past Month at Unknown time  . megestrol (MEGACE) 40 MG/ML suspension Take 800 mg by mouth daily.   Past Week at Unknown time  . traMADol (ULTRAM) 50 MG tablet Take 1 tablet (50 mg total) by mouth every 6 (six) hours as needed for severe pain. (Patient not  taking: Reported on 01/15/2017) 30 tablet 0 Not Taking at Unknown time  . tranexamic acid (LYSTEDA) 650 MG TABS tablet Take 2 tablets (1,300 mg total) by mouth 3 (three) times daily. Take during menses for a maximum of five days (Patient taking differently: Take 1,300 mg by mouth See admin instructions. Take 1300 mg by mouth 3 times daily during menses for a maximum for 5 days) 30 tablet 2 More than a month at Unknown time    ROS  She works at [employer]. She denies dyspareunia. She does not use contraception.   Blood pressure (!) 142/97, pulse 83, temperature 98.7 F (37.1 C), temperature source Oral, resp. rate 16, SpO2 100 %. Physical Exam Heart- rrr Lungs- CTAB Abd- benign, uterus palpable to 2 cm below the umbilicus  Results for orders placed or performed during the hospital encounter of 02/04/17 (from the past 24 hour(s))  Pregnancy, urine     Status: None   Collection Time: 02/04/17  7:20 AM  Result Value Ref Range   Preg Test, Ur NEGATIVE NEGATIVE  Type and screen [place]     Status: None   Collection Time: 02/04/17  7:24 AM  Result Value Ref Range   ABO/RH(D) B POS  Antibody Screen NEG    Sample Expiration 02/07/2017     No results found.  Assessment/Plan: Symptomatic fibroids. Plan for removal of uterus, tubes, and ovaries. She would like to have her appendix removed as well.  She understands the risks of surgery, including, but not to infection, bleeding, DVTs, damage to bowel, bladder, ureters. She wishes to proceed.     Emily Filbert 02/04/2017, 9:03 AM

## 2017-02-04 NOTE — Anesthesia Postprocedure Evaluation (Signed)
Anesthesia Post Note  Patient: [name]  Procedure(s) Performed: HYSTERECTOMY ABDOMINAL WITH SALPINGO-OOPHORECTOMY (Bilateral )     Patient location during evaluation: [facility] Anesthesia Type: General Level of consciousness: awake and alert and oriented Pain management: pain level controlled Vital Signs Assessment: post-procedure vital signs reviewed and stable Respiratory status: spontaneous breathing, nonlabored ventilation, respiratory function stable and patient connected to nasal cannula oxygen Cardiovascular status: blood pressure returned to baseline Postop Assessment: adequate PO intake and no apparent nausea or vomiting Anesthetic complications: no    Last Vitals:  Vitals:   02/04/17 1303 02/04/17 1700  BP: 119/80 (!) 149/78  Pulse: 79 68  Resp: 16 16  Temp: 36.7 C 36.8 C  SpO2: 100% 100%    Last Pain:  Vitals:   02/04/17 1709  TempSrc:   PainSc: 6    Pain Goal: Patients Stated Pain Goal: 3 (02/04/17 1709)               Willa Rough

## 2017-02-04 NOTE — Anesthesia Preprocedure Evaluation (Addendum)
Anesthesia Evaluation  Patient identified by MRN, date of birth, ID band Patient awake    Reviewed: Allergy & Precautions, NPO status , Patient's Chart, lab work & pertinent test results  Airway Mallampati: II  TM Distance: >3 FB Neck ROM: Full    Dental no notable dental hx.    Pulmonary neg pulmonary ROS,    Pulmonary exam normal breath sounds clear to auscultation       Cardiovascular hypertension, Pt. on medications negative cardio ROS Normal cardiovascular exam Rhythm:Regular Rate:Normal     Neuro/Psych negative neurological ROS  negative psych ROS   GI/Hepatic negative GI ROS, Neg liver ROS,   Endo/Other  negative endocrine ROS  Renal/GU negative Renal ROS  negative genitourinary   Musculoskeletal negative musculoskeletal ROS (+)   Abdominal   Peds negative pediatric ROS (+)  Hematology negative hematology ROS (+)   Anesthesia Other Findings L false eye.  Reproductive/Obstetrics negative OB ROS                            Anesthesia Physical Anesthesia Plan  ASA: II  Anesthesia Plan: General   Post-op Pain Management:    Induction: Intravenous  PONV Risk Score and Plan: 4 or greater and Ondansetron, Dexamethasone, Scopolamine patch - Pre-op and Midazolam  Airway Management Planned: Oral ETT  Additional Equipment:   Intra-op Plan:   Post-operative Plan: Extubation in OR  Informed Consent: I have reviewed the patients History and Physical, chart, labs and discussed the procedure including the risks, benefits and alternatives for the proposed anesthesia with the patient or authorized representative who has indicated his/her understanding and acceptance.   Dental advisory given  Plan Discussed with: CRNA  Anesthesia Plan Comments:         Anesthesia Quick Evaluation

## 2017-02-05 LAB — CBC
HEMATOCRIT: 40.3 % (ref 36.0–46.0)
HEMOGLOBIN: 13.4 g/dL (ref 12.0–15.0)
MCH: 29.8 pg (ref 26.0–34.0)
MCHC: 33.3 g/dL (ref 30.0–36.0)
MCV: 89.8 fL (ref 78.0–100.0)
Platelets: 226 10*3/uL (ref 150–400)
RBC: 4.49 MIL/uL (ref 3.87–5.11)
RDW: 18 % — ABNORMAL HIGH (ref 11.5–15.5)
WBC: 13.2 10*3/uL — AB (ref 4.0–10.5)

## 2017-02-05 MED ORDER — ONDANSETRON HCL 4 MG PO TABS: 4.0000 mg | ORAL_TABLET | Freq: Four times a day (QID) | ORAL | Status: DC | PRN

## 2017-02-05 MED ORDER — IBUPROFEN 800 MG PO TABS
800.0000 mg | ORAL_TABLET | Freq: Three times a day (TID) | ORAL | Status: DC | PRN
  Administered 2017-02-05 – 2017-02-06 (×3): 800 mg via ORAL
  Filled 2017-02-05 (×3): qty 1

## 2017-02-05 MED ORDER — ONDANSETRON HCL 4 MG/2ML IJ SOLN: 4.0000 mg | Freq: Four times a day (QID) | INTRAMUSCULAR | Status: DC | PRN

## 2017-02-05 MED ORDER — HYDROMORPHONE HCL 1 MG/ML IJ SOLN: 0.2000 mg | INTRAMUSCULAR | Status: DC | PRN

## 2017-02-05 MED ORDER — AMLODIPINE BESYLATE 10 MG PO TABS
10.0000 mg | ORAL_TABLET | Freq: Every day | ORAL | Status: DC
  Administered 2017-02-05 – 2017-02-06 (×2): 10 mg via ORAL
  Filled 2017-02-05 (×2): qty 1

## 2017-02-05 MED ORDER — OXYCODONE-ACETAMINOPHEN 5-325 MG PO TABS: 1.0000 | ORAL_TABLET | ORAL | 0 refills | Status: DC | PRN

## 2017-02-05 MED ORDER — IBUPROFEN 800 MG PO TABS: 800.0000 mg | ORAL_TABLET | Freq: Three times a day (TID) | ORAL | Status: DC | PRN

## 2017-02-05 NOTE — Progress Notes (Signed)
1 Day Post-Op Procedure(s) (LRB): HYSTERECTOMY ABDOMINAL WITH SALPINGO-OOPHORECTOMY (Bilateral)  Subjective: Patient reports tolerating PO, + flatus and no problems voiding.    Objective: I have reviewed patient's vital signs, intake and output, medications and labs.  General: alert Resp: clear to auscultation bilaterally Cardio: regular rate and rhythm, S1, S2 normal, no murmur, click, rub or gallop GI: soft, non-tender; bowel sounds normal; no masses,  no organomegaly  Assessment: s/p Procedure(s): HYSTERECTOMY ABDOMINAL WITH SALPINGO-OOPHORECTOMY (Bilateral): stable  Plan: Discharge home tomorrow  LOS: 1 day    [place] 02/05/2017, 1:14 PM

## 2017-02-06 NOTE — Progress Notes (Signed)
Discharge instructions reviewed with patient.  Patient states understanding of home care, medications, activity, signs/symptoms to report to MD and return MD office visit.  Patients significant other and family will assist with her care @ home.  No home  equipment needed, patient has prescriptions and all personal belongings.  Patient discharged via wheelchair in stable condition with staff without incident.  

## 2017-02-06 NOTE — Discharge Instructions (Signed)
Abdominal Hysterectomy, Care After °This sheet gives you information about how to care for yourself after your procedure. Your doctor may also give you more specific instructions. If you have problems or questions, contact your doctor. °Follow these instructions at home: °Bathing °· Do not take baths, swim, or use a hot tub until your doctor says it is okay. Ask your doctor if you can take showers. You may only be allowed to take sponge baths for bathing. °· Keep the bandage (dressing) dry until your doctor says it can be taken off. °Surgical cut ( °incision) care °· Follow instructions from your doctor about how to take care of your cut from surgery. Make sure you: °? Wash your hands with soap and water before you change your bandage (dressing). If you cannot use soap and water, use hand sanitizer. °? Change your bandage as told by your doctor. °? Leave stitches (sutures), skin glue, or skin tape (adhesive) strips in place. They may need to stay in place for 2 weeks or longer. If tape strips get loose and curl up, you may trim the loose edges. Do not remove tape strips completely unless your doctor says it is okay. °· Check your surgical cut area every day for signs of infection. Check for: °? Redness, swelling, or pain. °? Fluid or blood. °? Warmth. °? Pus or a bad smell. °Activity °· Do gentle, daily exercise as told by your doctor. You may be told to take short walks every day and go farther each time. °· Do not lift anything that is heavier than 10 lb (4.5 kg), or the limit that your doctor tells you, until he or she says that it is safe. °· Do not drive or use heavy machinery while taking prescription pain medicine. °· Do not drive for 24 hours if you were given a medicine to help you relax (sedative). °· Follow your doctor's advice about exercise, driving, and general activities. Ask your doctor what activities are safe for you. °Lifestyle °· Do not douche, use tampons, or have sex for at least 6 weeks or as  told by your doctor. °· Do not drink alcohol until your doctor says it is okay. °· Drink enough fluid to keep your pee (urine) clear or pale yellow. °· Try to have someone at home with you for the first 1-2 weeks to help. °· Do not use any products that contain nicotine or tobacco, such as cigarettes and e-cigarettes. These can slow down healing. If you need help quitting, ask your doctor. °General instructions °· Take over-the-counter and prescription medicines only as told by your doctor. °· Do not take aspirin or ibuprofen. These medicines can cause bleeding. °· To prevent or treat constipation while you are taking prescription pain medicine, your doctor may suggest that you: °? Drink enough fluid to keep your urine clear or pale yellow. °? Take over-the-counter or prescription medicines. °? Eat foods that are high in fiber, such as: °§ Fresh fruits and vegetables. °§ Whole grains. °§ Beans. °? Limit foods that are high in fat and processed sugars, such as fried and sweet foods. °· Keep all follow-up visits as told by your doctor. This is important. °Contact a doctor if: °· You have chills or fever. °· You have redness, swelling, or pain around your cut. °· You have fluid or blood coming from your cut. °· Your cut feels warm to the touch. °· You have pus or a bad smell coming from your cut. °· Your cut breaks   open. °· You feel dizzy or light-headed. °· You have pain or bleeding when you pee. °· You keep having watery poop (diarrhea). °· You keep feeling sick to your stomach (nauseous) or keep throwing up (vomiting). °· You have unusual fluid (discharge) coming from your vagina. °· You have a rash. °· You have a reaction to your medicine. °· Your pain medicine does not help. °Get help right away if: °· You have a fever and your symptoms get worse all of a sudden. °· You have very bad belly (abdominal) pain. °· You are short of breath. °· You pass out (faint). °· You have pain, swelling, or redness of your  leg. °· You bleed a lot from your vagina and notice clumps of blood (clots). °Summary °· Do not take baths, swim, or use a hot tub until your doctor says it is okay. Ask your doctor if you can take showers. You may only be allowed to take sponge baths for bathing. °· Follow your doctor's advice about exercise, driving, and general activities. Ask your doctor what activities are safe for you. °· Do not lift anything that is heavier than 10 lb (4.5 kg), or the limit that your doctor tells you, until he or she says that it is safe. °· Try to have someone at home with you for the first 1-2 weeks to help. °This information is not intended to replace advice given to you by your health care provider. Make sure you discuss any questions you have with your health care provider. °Document Released: 11/12/2007 Document Revised: 01/22/2016 Document Reviewed: 01/22/2016 °Elsevier Interactive Patient Education © 2017 [employer] Inc. ° °

## 2017-02-06 NOTE — Discharge Summary (Signed)
Physician Discharge Summary  Patient ID: [name] MRN: [id number] DOB/AGE: December 11, 1965 51 y.o.  Admit date: 02/04/2017 Discharge date: 02/06/2017  Admission Diagnoses: symptomatic fibroids  Discharge Diagnoses: same Active Problems:   Post-operative state   Discharged Condition: good  Hospital Course: She underwent an uncomplicated TAH/BSO. By POD #1 she was voiding, tolerating po well, having flatus, and ambulating. By POD #2 she voiced her readiness to go home.   Consults: None  Significant Diagnostic Studies: labs: her post op hbg was 13.4  Treatments: surgery: TAH/BSO  Discharge Exam: Blood pressure (!) 135/91, pulse 82, temperature 99.3 F (37.4 C), temperature source Oral, resp. rate 18, height 5' (1.524 m), weight 52.8 kg (116 lb 6.4 oz), SpO2 100 %. General appearance: alert Resp: clear to auscultation bilaterally Cardio: regular rate and rhythm, S1, S2 normal, no murmur, click, rub or gallop GI: soft, non-tender; bowel sounds normal; no masses,  no organomegaly Incision/Wound: honeycomb dressing clean, dry, intact  Disposition: 01-Home or Self Care   Allergies as of 02/06/2017   No Known Allergies     Medication List    STOP taking these medications   megestrol 40 MG/ML suspension Commonly known as:  MEGACE     TAKE these medications   amLODipine 10 MG tablet Commonly known as:  NORVASC Take 10 mg by mouth daily.   ferrous sulfate 325 (65 FE) MG tablet Commonly known as:  FERROUSUL Take 1 tablet (325 mg total) by mouth 2 (two) times daily with a meal.   ibuprofen 800 MG tablet Commonly known as:  ADVIL,MOTRIN Take 1 tablet (800 mg total) by mouth 3 (three) times daily with meals as needed for headache or moderate pain.   oxyCODONE-acetaminophen 5-325 MG tablet Commonly known as:  PERCOCET/ROXICET Take 1 tablet by mouth every 3 (three) hours as needed (moderate pain).   traMADol 50 MG tablet Commonly known as:  ULTRAM Take 1 tablet (50  mg total) by mouth every 6 (six) hours as needed for severe pain.   tranexamic acid 650 MG Tabs tablet Commonly known as:  LYSTEDA Take 2 tablets (1,300 mg total) by mouth 3 (three) times daily. Take during menses for a maximum of five days What changed:    when to take this  additional instructions      Follow-up Information    Emily Filbert, MD. Schedule an appointment as soon as possible for a visit in 6 weeks.   Specialty:  Obstetrics and Gynecology Contact information: [place] [address] [phone]           Signed: Emily Filbert 02/06/2017, 11:29 AM

## 2017-02-10 ENCOUNTER — Telehealth: Payer: Self-pay | Admitting: *Deleted

## 2017-02-10 NOTE — Telephone Encounter (Signed)
[name] left a message 02/05/17 stating the dates on her Short term disability forms needs to be changed. States she is not due to see Dr.Dove until 03/26/17 and so her date to go back to work should be 03/29/17. States she needs form faxed as soon as it is completed. Asks for a call back.

## 2017-02-23 NOTE — Telephone Encounter (Signed)
Called patient stating I am returning her phone call. Patient states she doesn't know if the little pieces of tape should still be on or not. Patient states a couple fell off when she was giving herself a sponge bath. Patient states she has been careful not to get her incision wet. Told patient she can and should be getting her incision wet with warm soapy water but do not rub or scrub incision. Also instructed patient to pat incision dry thoroughly. Patient verbalized understanding to all. Told patient if a couple steri strips fall off that's okay, they will eventually all come off. Patient verbalized understanding. Asked patient if she still had a question about her FMLA papers. Patient states her return to work date isn't until 2/1 but her doctor's visit is 2/8. Told patient that is okay, she can return to work 2/1. Told patient sometimes the doctor's appt is after that date and that's okay. Patient verbalized understanding and had no questions

## 2017-02-27 ENCOUNTER — Other Ambulatory Visit: Payer: Self-pay | Admitting: Obstetrics & Gynecology

## 2017-03-26 ENCOUNTER — Ambulatory Visit (INDEPENDENT_AMBULATORY_CARE_PROVIDER_SITE_OTHER): Payer: 59 | Admitting: Obstetrics & Gynecology

## 2017-03-26 ENCOUNTER — Encounter: Payer: Self-pay | Admitting: Obstetrics & Gynecology

## 2017-03-26 VITALS — BP 159/83 | HR 77 | Wt 123.0 lb

## 2017-03-26 DIAGNOSIS — Z9889 Other specified postprocedural states: Secondary | ICD-10-CM

## 2017-03-26 MED ORDER — ESTRADIOL 1 MG PO TABS: 1.0000 mg | ORAL_TABLET | Freq: Every day | ORAL | 12 refills | Status: DC
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
## 2017-05-26 ENCOUNTER — Other Ambulatory Visit: Payer: Self-pay | Admitting: Obstetrics and Gynecology

## 2017-06-21 ENCOUNTER — Encounter: Payer: Self-pay | Admitting: Obstetrics & Gynecology

## 2017-06-21 ENCOUNTER — Ambulatory Visit (INDEPENDENT_AMBULATORY_CARE_PROVIDER_SITE_OTHER): Payer: 59 | Admitting: Obstetrics & Gynecology

## 2017-06-21 VITALS — BP 124/84 | HR 80 | Ht 60.0 in | Wt 138.0 lb

## 2017-06-21 DIAGNOSIS — E8941 Symptomatic postprocedural ovarian failure: Secondary | ICD-10-CM | POA: Diagnosis not present

## 2017-06-21 DIAGNOSIS — Z0000 Encounter for general adult medical examination without abnormal findings: Secondary | ICD-10-CM

## 2017-06-21 DIAGNOSIS — Z23 Encounter for immunization: Secondary | ICD-10-CM | POA: Diagnosis not present

## 2017-06-21 MED ORDER — AMLODIPINE BESYLATE 10 MG PO TABS: 10.0000 mg | ORAL_TABLET | Freq: Every day | ORAL | 3 refills | Status: DC

## 2017-06-21 MED ORDER — ESTRADIOL 1 MG PO TABS: 2.0000 mg | ORAL_TABLET | Freq: Every day | ORAL | 12 refills | Status: DC

## 2017-06-21 NOTE — Progress Notes (Signed)
   Subjective:    Patient ID: Benard Rink, female    DOB: [birth year], 52 y.o.   MRN: [id number]  HPI 52 yo married P2 here for a follow up after starting estradiol for ERT after a TAH/BSO 12/18. She reports that she is still having hot flashes and night sweats, some improvement. She used KY with sex for lubricant.   Review of Systems     Objective:   Physical Exam Breathing, conversing, and ambulating normally [place]- healing great Well nourished, well hydrated Black female, no apparent distress Abd- benign     Assessment & Plan:  Refer to fam med (Dr. Lin Landsman) regarding colon screening TDAP today Increase estradiol to 2 mg daily

## 2017-10-19 ENCOUNTER — Other Ambulatory Visit: Payer: Self-pay | Admitting: Obstetrics & Gynecology

## 2017-10-20 ENCOUNTER — Telehealth: Payer: Self-pay

## 2017-10-20 NOTE — Telephone Encounter (Signed)
Per Dr. Hulan Fray, recommendation for pt to call her PCP for refill on her BP medication.  LM for pt provider's recommendation.  MyChart message sent.

## 2017-11-02 ENCOUNTER — Telehealth: Payer: Self-pay

## 2017-11-02 ENCOUNTER — Other Ambulatory Visit: Payer: Self-pay

## 2017-11-02 DIAGNOSIS — N939 Abnormal uterine and vaginal bleeding, unspecified: Secondary | ICD-10-CM

## 2017-11-02 MED ORDER — ESTRADIOL 1 MG PO TABS
2.0000 mg | ORAL_TABLET | Freq: Every day | ORAL | 12 refills | Status: DC
Start: 2017-11-02 — End: 2024-01-11

## 2017-11-02 NOTE — Telephone Encounter (Signed)
Called pt back to let her know that the [place] on the Rx has been corrected & Resent to pharmacy. Pt verbalized understanding.

## 2017-11-02 NOTE — Telephone Encounter (Signed)
Pt called on Nurse Line 11/01/17 @ 3:19p, to state that her Pharmacy will not give her the correct amnt of pills that were Rx by Dr. Hulan Fray in May for Estradiol 1 mg tab, twice a day. Request call back.

## 2018-09-07 ENCOUNTER — Ambulatory Visit: Payer: Self-pay | Admitting: Cardiology

## 2018-09-09 ENCOUNTER — Encounter: Payer: Self-pay | Admitting: Cardiology

## 2018-09-12 ENCOUNTER — Other Ambulatory Visit: Payer: Self-pay

## 2018-09-12 ENCOUNTER — Encounter: Payer: Self-pay | Admitting: Cardiology

## 2018-09-12 ENCOUNTER — Ambulatory Visit: Payer: Commercial Managed Care - PPO | Admitting: Cardiology

## 2018-09-12 VITALS — BP 146/103 | HR 98 | Temp 97.7°F | Ht 60.0 in | Wt 150.7 lb

## 2018-09-12 DIAGNOSIS — R0602 Shortness of breath: Secondary | ICD-10-CM | POA: Diagnosis not present

## 2018-09-12 DIAGNOSIS — R600 Localized edema: Secondary | ICD-10-CM

## 2018-09-12 DIAGNOSIS — I10 Essential (primary) hypertension: Secondary | ICD-10-CM

## 2018-09-12 DIAGNOSIS — R0609 Other forms of dyspnea: Secondary | ICD-10-CM

## 2018-09-12 HISTORY — DX: Shortness of breath: R06.02

## 2018-09-12 HISTORY — DX: Localized edema: R60.0

## 2018-09-12 MED ORDER — SPIRONOLACTONE 25 MG PO TABS: 25.0000 mg | ORAL_TABLET | Freq: Every day | ORAL | 3 refills | Status: DC

## 2018-09-12 NOTE — Progress Notes (Signed)
Patient referred by Lin Landsman, MD for leg swelling  Subjective:   [name], female    DOB: 04/18/1965, 53 y.o.   MRN: [id number]   Chief Complaint  Patient presents with  . New Patient (Initial Visit)  . Shortness of Breath    HPI  53 y.o. African-American female with hypertension, now referred for evaluation of shortness of breath and leg swelling.  Patient lives by herself, works at [employer].  For last few weeks, she has noticed shortness of breath with climbing three flights of stairs at work.  This is unusual for her.  She has also noticed swelling in both her legs, left more than right, associated with redness and pain.  She was started on hydrochlorothiazide 25 mg few weeks ago.  She has been on amlodipine 10 mg for several years.  With this, leg swelling has improved.  Blood pressure remains uncontrolled.  She denies any exertional chest pain.  She has retrosternal burning sensation only after eating spicy meals.   Past Medical History:  Diagnosis Date  . Anemia   . History of blood transfusion 11/19/2016   4 units at [facility]  . Hypertension   . HYPERTENSION 01/06/2008   Qualifier: Diagnosis of  By: Ronnald Ramp MD, Arvid Right.   . Presence of artificial left eye   . 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 CESAREAN SECTION     2 previous  . TRANFUSION  11/4016   RECEIVED 4 UNITS OF BLOOD   . TUBAL LIGATION       Social History   Socioeconomic History  . Marital status: Divorced    Spouse name: Not on file  . Number of children: 2  . Years of education: Not on file  . Highest education level: Not on file  Occupational History  . Not on file  Social Needs  . Financial resource strain: Not on file  . Food insecurity    Worry: Not on file    Inability: Not on file  . Transportation needs    Medical: Not on file    Non-medical: Not on file  Tobacco Use   . Smoking status: Never Smoker  . Smokeless tobacco: Never Used  Substance and Sexual Activity  . Alcohol use: No  . Drug use: No  . Sexual activity: Not Currently    Birth control/protection: Surgical  Lifestyle  . Physical activity    Days per week: Not on file    Minutes per session: Not on file  . Stress: Not on file  Relationships  . Social Herbalist on phone: Not on file    Gets together: Not on file    Attends religious service: Not on file    Active member of club or organization: Not on file    Attends meetings of clubs or organizations: Not on file    Relationship status: Not on file  . Intimate partner violence    Fear of current or ex partner: Not on file    Emotionally abused: Not on file    Physically abused: Not on file    Forced sexual activity: Not on file  Other Topics Concern  . Not on file  Social History Narrative   Pt is separated from current husband.     Family History  Problem Relation Age of Onset  .  Hypertension Mother   . Cancer Maternal Aunt        breast  . Stroke Father   . Heart attack Maternal Grandfather        Age 26  . Stroke Paternal Grandmother     Current Outpatient Medications on File Prior to Visit  Medication Sig Dispense Refill  . amLODipine (NORVASC) 10 MG tablet Take 1 tablet (10 mg total) by mouth daily. 31 tablet 3  . estradiol (ESTRACE) 1 MG tablet Take 2 tablets (2 mg total) by mouth daily. 60 tablet 12  . hydrochlorothiazide (HYDRODIURIL) 25 MG tablet Take 25 mg by mouth daily.     No current facility-administered medications on file prior to visit.     Cardiovascular studies:  EKG 09/12/2018: Sinus rhythm 87 bpm.  Possible old anteroseptal infarct.   Recent labs: 08/18/2018: Glucose 92. BUN/Cr 15/0.58. eGFR normal. Na/K 140/3.4. Rest of the CMP normal. H/H 13/39. MCV 89. Platelets 225.  Chol 129, TG 55, HDL 39, LDL 76. TSH, free T4 normal.    Review of Systems  Constitution: Negative for  decreased appetite, malaise/fatigue, weight gain and weight loss.  HENT: Negative for congestion.   Eyes: Negative for visual disturbance.  Cardiovascular: Positive for dyspnea on exertion and leg swelling. Negative for chest pain, palpitations and syncope.  Respiratory: Negative for cough.   Endocrine: Negative for cold intolerance.  Hematologic/Lymphatic: Does not bruise/bleed easily.  Skin: Negative for itching and rash.  Musculoskeletal: Negative for myalgias.  Gastrointestinal: Negative for abdominal pain, nausea and vomiting.  Genitourinary: Negative for dysuria.  Neurological: Negative for dizziness and weakness.  Psychiatric/Behavioral: The patient is not nervous/anxious.   All other systems reviewed and are negative.        Vitals:   09/12/18 0824  BP: (!) 146/103  Pulse: 98  Temp: 97.7 F (36.5 C)  SpO2: 100%     Body mass index is 29.43 kg/m. Filed Weights   09/12/18 0824  Weight: 150 lb 11.2 oz (68.4 kg)     Objective:   Physical Exam  Constitutional: She is oriented to person, place, and time. She appears well-developed and well-nourished. No distress.  HENT:  Head: Normocephalic and atraumatic.  Eyes: Pupils are equal, round, and reactive to light. Conjunctivae are normal.  Neck: No JVD present.  Cardiovascular: Normal rate, regular rhythm and intact distal pulses.  Pulmonary/Chest: Effort normal and breath sounds normal. She has no wheezes. She has no rales.  Abdominal: Soft. Bowel sounds are normal. There is no rebound.  Musculoskeletal:        General: No edema.  Lymphadenopathy:    She has no cervical adenopathy.  Neurological: She is alert and oriented to person, place, and time. No cranial nerve deficit.  Skin: Skin is warm and dry.  Psychiatric: She has a normal mood and affect.  Nursing note and vitals reviewed.         Assessment & Recommendations:   53 y.o. African-American female with hypertension, now referred for evaluation of  shortness of breath and leg swelling.  1. Dyspnea on exertion, leg edema I suspect this is secondary to uncontrolled hypertension.  I will obtain echocardiogram.  Given her K of 3.4 and uncontrolled hypertension, I have started her on spironolactone 25 mg once daily.  2. Essential hypertension As above.   Thank you for referring the patient to [place]. Please feel free to contact with any questions.  Nigel Mormon, MD [facility] Pager: [phone] Office: [phone] If no  answer Cell [phone]

## 2018-10-05 ENCOUNTER — Other Ambulatory Visit: Payer: Commercial Managed Care - PPO

## 2018-10-11 NOTE — Progress Notes (Deleted)
Patient referred by Lin Landsman, MD for leg swelling  Subjective:   [name], female    DOB: 12-09-1965, 53 y.o.   MRN: [id number]  I connected with the patient on 10/12/2018 by a video enabled telemedicine application and verified that I am speaking with the correct person using two identifiers.     I discussed the limitations of evaluation and management by telemedicine and the availability of in person appointments. The patient expressed understanding and agreed to proceed.   This visit type was conducted due to national recommendations for restrictions regarding the COVID-19 Pandemic (e.g. social distancing).  This format is felt to be most appropriate for this patient at this time.  All issues noted in this document were discussed and addressed.  No physical exam was performed (except for noted visual exam findings with Tele health visits).  The patient has consented to conduct a Tele health visit and understands insurance will be billed.   No chief complaint on file.   HPI  53 y.o. African-American female with hypertension, now referred for evaluation of shortness of breath and leg swelling.  Patient lives by herself, works at [employer].  For last few weeks, she has noticed shortness of breath with climbing three flights of stairs at work.  This is unusual for her.  She has also noticed swelling in both her legs, left more than right, associated with redness and pain.  She was started on hydrochlorothiazide 25 mg few weeks ago.  She has been on amlodipine 10 mg for several years.  With this, leg swelling has improved.  Blood pressure remains uncontrolled.  She denies any exertional chest pain.  She has retrosternal burning sensation only after eating spicy meals.   Past Medical History:  Diagnosis Date  . Anemia   . History of blood transfusion 11/19/2016   4 units at [facility]  . Hypertension   . HYPERTENSION 01/06/2008   Qualifier: Diagnosis of  By: Ronnald Ramp MD, Arvid Right.    . Presence of artificial left eye   . 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 CESAREAN SECTION     2 previous  . TRANFUSION  11/4016   RECEIVED 4 UNITS OF BLOOD   . TUBAL LIGATION       Social History   Socioeconomic History  . Marital status: Divorced    Spouse name: Not on file  . Number of children: 2  . Years of education: Not on file  . Highest education level: Not on file  Occupational History  . Not on file  Social Needs  . Financial resource strain: Not on file  . Food insecurity    Worry: Not on file    Inability: Not on file  . Transportation needs    Medical: Not on file    Non-medical: Not on file  Tobacco Use  . Smoking status: Never Smoker  . Smokeless tobacco: Never Used  Substance and Sexual Activity  . Alcohol use: No  . Drug use: No  . Sexual activity: Not Currently    Birth control/protection: Surgical  Lifestyle  . Physical activity    Days per week: Not on file    Minutes per session: Not on file  . Stress: Not on file  Relationships  . Social connections    Talks on phone: Not on file  Gets together: Not on file    Attends religious service: Not on file    Active member of club or organization: Not on file    Attends meetings of clubs or organizations: Not on file    Relationship status: Not on file  . Intimate partner violence    Fear of current or ex partner: Not on file    Emotionally abused: Not on file    Physically abused: Not on file    Forced sexual activity: Not on file  Other Topics Concern  . Not on file  Social History Narrative   Pt is separated from current husband.     Family History  Problem Relation Age of Onset  . Hypertension Mother   . Cancer Maternal Aunt        breast  . Stroke Father   . Heart attack Maternal Grandfather        Age 65  . Stroke Paternal Grandmother     Current Outpatient  Medications on File Prior to Visit  Medication Sig Dispense Refill  . acetaminophen (TYLENOL) 325 MG tablet Take 650 mg by mouth as needed for mild pain.    Marland Kitchen amLODipine (NORVASC) 10 MG tablet Take 1 tablet (10 mg total) by mouth daily. 31 tablet 3  . estradiol (ESTRACE) 1 MG tablet Take 2 tablets (2 mg total) by mouth daily. 60 tablet 12  . hydrochlorothiazide (HYDRODIURIL) 25 MG tablet Take 25 mg by mouth daily.    Marland Kitchen spironolactone (ALDACTONE) 25 MG tablet Take 1 tablet (25 mg total) by mouth daily. 30 tablet 3   No current facility-administered medications on file prior to visit.     Cardiovascular studies:  EKG 09/12/2018: Sinus rhythm 87 bpm.  Possible old anteroseptal infarct.   Recent labs: 08/18/2018: Glucose 92. BUN/Cr 15/0.58. eGFR normal. Na/K 140/3.4. Rest of the CMP normal. H/H 13/39. MCV 89. Platelets 225.  Chol 129, TG 55, HDL 39, LDL 76. TSH, free T4 normal.    Review of Systems  Constitution: Negative for decreased appetite, malaise/fatigue, weight gain and weight loss.  HENT: Negative for congestion.   Eyes: Negative for visual disturbance.  Cardiovascular: Positive for dyspnea on exertion and leg swelling. Negative for chest pain, palpitations and syncope.  Respiratory: Negative for cough.   Endocrine: Negative for cold intolerance.  Hematologic/Lymphatic: Does not bruise/bleed easily.  Skin: Negative for itching and rash.  Musculoskeletal: Negative for myalgias.  Gastrointestinal: Negative for abdominal pain, nausea and vomiting.  Genitourinary: Negative for dysuria.  Neurological: Negative for dizziness and weakness.  Psychiatric/Behavioral: The patient is not nervous/anxious.   All other systems reviewed and are negative.        There were no vitals filed for this visit.   There is no height or weight on file to calculate BMI. There were no vitals filed for this visit.   Objective:   Physical Exam  Constitutional: She is oriented to person,  place, and time. She appears well-developed and well-nourished. No distress.  HENT:  Head: Normocephalic and atraumatic.  Eyes: Pupils are equal, round, and reactive to light. Conjunctivae are normal.  Neck: No JVD present.  Cardiovascular: Normal rate, regular rhythm and intact distal pulses.  Pulmonary/Chest: Effort normal and breath sounds normal. She has no wheezes. She has no rales.  Abdominal: Soft. Bowel sounds are normal. There is no rebound.  Musculoskeletal:        General: No edema.  Lymphadenopathy:    She has no cervical adenopathy.  Neurological:  She is alert and oriented to person, place, and time. No cranial nerve deficit.  Skin: Skin is warm and dry.  Psychiatric: She has a normal mood and affect.  Nursing note and vitals reviewed.         Assessment & Recommendations:   53 y.o. African-American female with hypertension, now referred for evaluation of shortness of breath and leg swelling.  1. Dyspnea on exertion, leg edema I suspect this is secondary to uncontrolled hypertension.  I will obtain echocardiogram.  Given her K of 3.4 and uncontrolled hypertension, I have started her on spironolactone 25 mg once daily.  2. Essential hypertension As above.   Thank you for referring the patient to [place]. Please feel free to contact with any questions.  Nigel Mormon, MD [facility] Pager: [phone] Office: [phone] If no answer Cell [phone]

## 2018-10-12 ENCOUNTER — Telehealth: Payer: Commercial Managed Care - PPO | Admitting: Cardiology

## 2018-10-20 ENCOUNTER — Other Ambulatory Visit: Payer: Self-pay | Admitting: Family Medicine

## 2018-10-20 DIAGNOSIS — Z1231 Encounter for screening mammogram for malignant neoplasm of breast: Secondary | ICD-10-CM

## 2018-10-31 ENCOUNTER — Other Ambulatory Visit: Payer: Self-pay

## 2018-10-31 ENCOUNTER — Ambulatory Visit
Admission: RE | Admit: 2018-10-31 | Discharge: 2018-10-31 | Disposition: A | Discharge status: Home / Self Care | Payer: Self-pay | Source: Ambulatory Visit | Attending: Family Medicine | Admitting: Family Medicine

## 2018-10-31 DIAGNOSIS — Z1231 Encounter for screening mammogram for malignant neoplasm of breast: Secondary | ICD-10-CM

## 2018-11-11 ENCOUNTER — Other Ambulatory Visit: Payer: Commercial Managed Care - PPO

## 2018-12-01 ENCOUNTER — Other Ambulatory Visit: Payer: Commercial Managed Care - PPO

## 2018-12-01 NOTE — Telephone Encounter (Signed)
From patient.

## 2018-12-01 NOTE — Telephone Encounter (Signed)
Please see message from the patient. Please offer her a virtual visit if she has BP cuff at home. If not, please ask her if she could come last week Oct/first week Nov, as it will be beyond potential infectious period.

## 2018-12-07 ENCOUNTER — Other Ambulatory Visit: Payer: Self-pay | Admitting: Cardiology

## 2018-12-07 DIAGNOSIS — R0609 Other forms of dyspnea: Secondary | ICD-10-CM

## 2018-12-07 DIAGNOSIS — R600 Localized edema: Secondary | ICD-10-CM

## 2019-03-07 ENCOUNTER — Other Ambulatory Visit: Payer: Self-pay | Admitting: Cardiology

## 2019-03-07 DIAGNOSIS — R0609 Other forms of dyspnea: Secondary | ICD-10-CM

## 2019-03-07 DIAGNOSIS — R600 Localized edema: Secondary | ICD-10-CM

## 2019-03-16 ENCOUNTER — Ambulatory Visit (INDEPENDENT_AMBULATORY_CARE_PROVIDER_SITE_OTHER): Payer: Commercial Managed Care - PPO

## 2019-03-16 ENCOUNTER — Other Ambulatory Visit: Payer: Self-pay

## 2019-03-16 DIAGNOSIS — R0609 Other forms of dyspnea: Secondary | ICD-10-CM

## 2019-03-16 DIAGNOSIS — R600 Localized edema: Secondary | ICD-10-CM

## 2019-03-21 NOTE — Progress Notes (Signed)
Called no answer left a vm to return call back

## 2019-03-21 NOTE — Progress Notes (Signed)
Called pt to inform her about her echo. Pt understood. Marland Kitchenl

## 2019-09-07 ENCOUNTER — Other Ambulatory Visit: Payer: Self-pay | Admitting: Cardiology

## 2019-09-07 DIAGNOSIS — R600 Localized edema: Secondary | ICD-10-CM

## 2019-09-07 DIAGNOSIS — R0609 Other forms of dyspnea: Secondary | ICD-10-CM

## 2019-11-24 ENCOUNTER — Other Ambulatory Visit: Payer: Self-pay | Admitting: Family Medicine

## 2019-11-24 ENCOUNTER — Other Ambulatory Visit: Payer: Self-pay

## 2019-11-24 ENCOUNTER — Ambulatory Visit
Admission: RE | Admit: 2019-11-24 | Discharge: 2019-11-24 | Disposition: A | Discharge status: Home / Self Care | Payer: Commercial Managed Care - PPO | Source: Ambulatory Visit | Attending: Family Medicine | Admitting: Family Medicine

## 2019-11-24 DIAGNOSIS — Z1231 Encounter for screening mammogram for malignant neoplasm of breast: Secondary | ICD-10-CM

## 2020-12-14 IMAGING — MG DIGITAL SCREENING BILAT W/ TOMO W/ CAD
8 series · 8 of 24 positions shown · non-contrast
Comparison: Previous exam(s).

CLINICAL DATA: Screening.

EXAM:
DIGITAL SCREENING BILATERAL MAMMOGRAM WITH TOMO AND CAD

[L CC synth-2D]
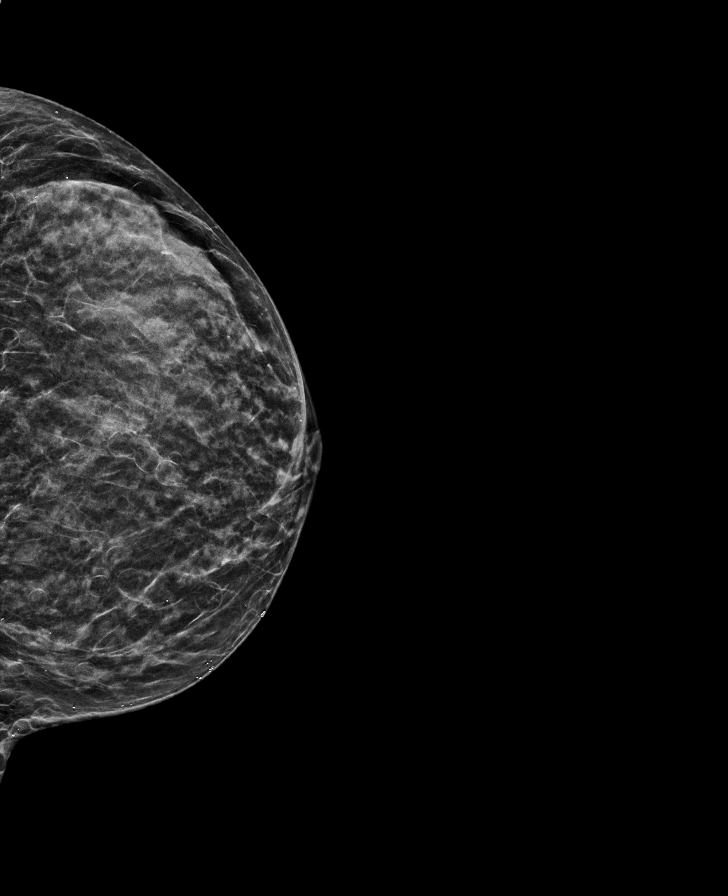

[R MLO synth-2D]
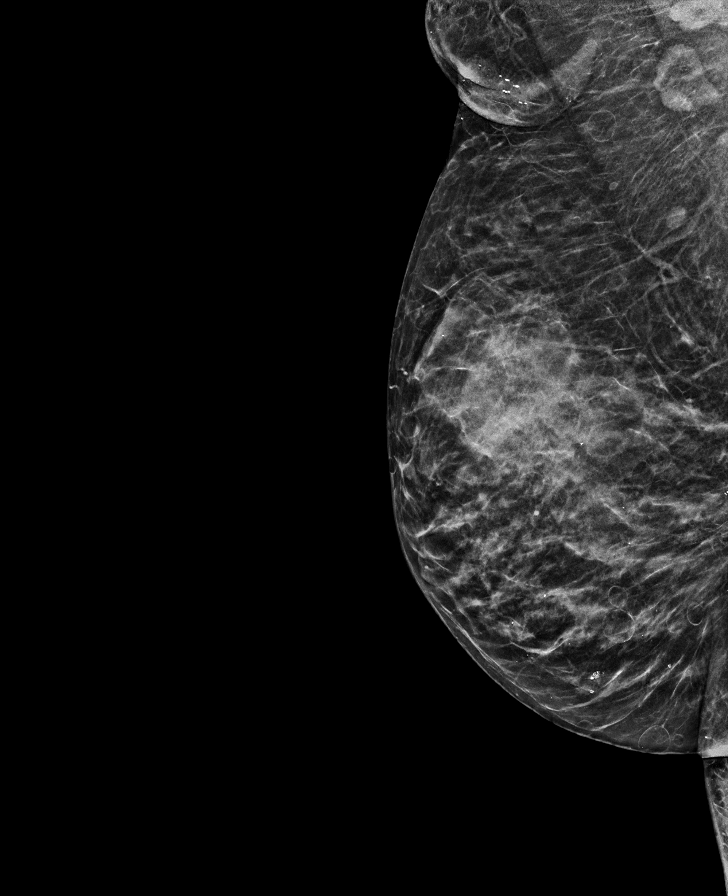

[R CC synth-2D]
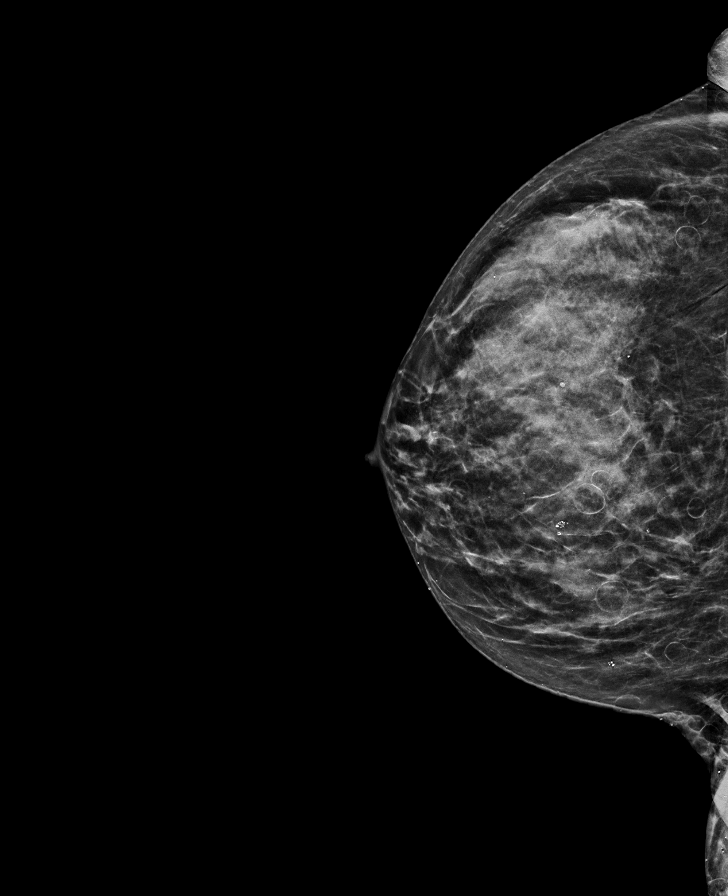

[L MLO synth-2D]
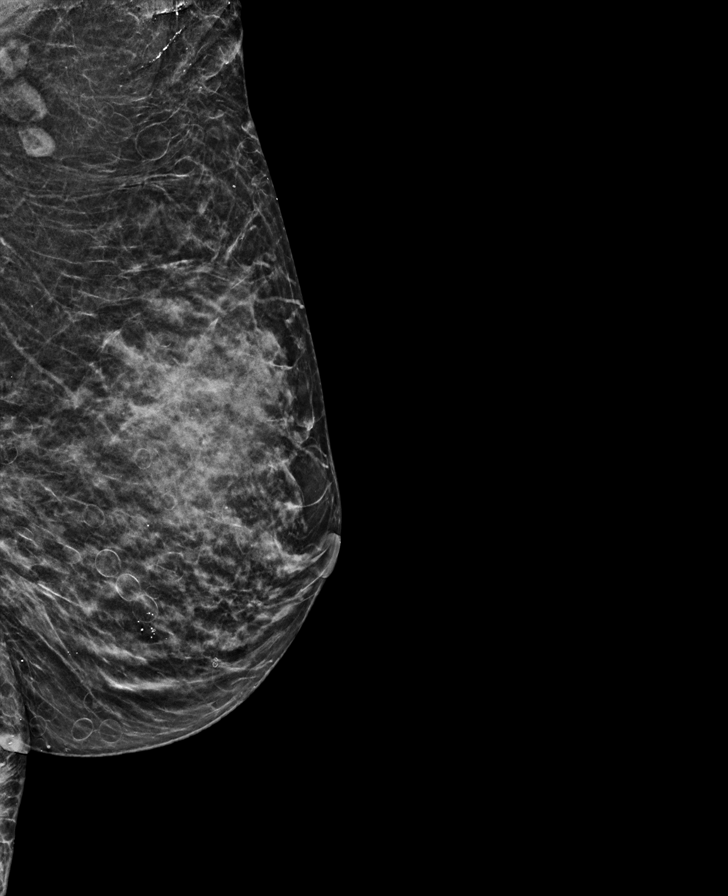

[R MLO tomo · tomo slice 23/46.0]
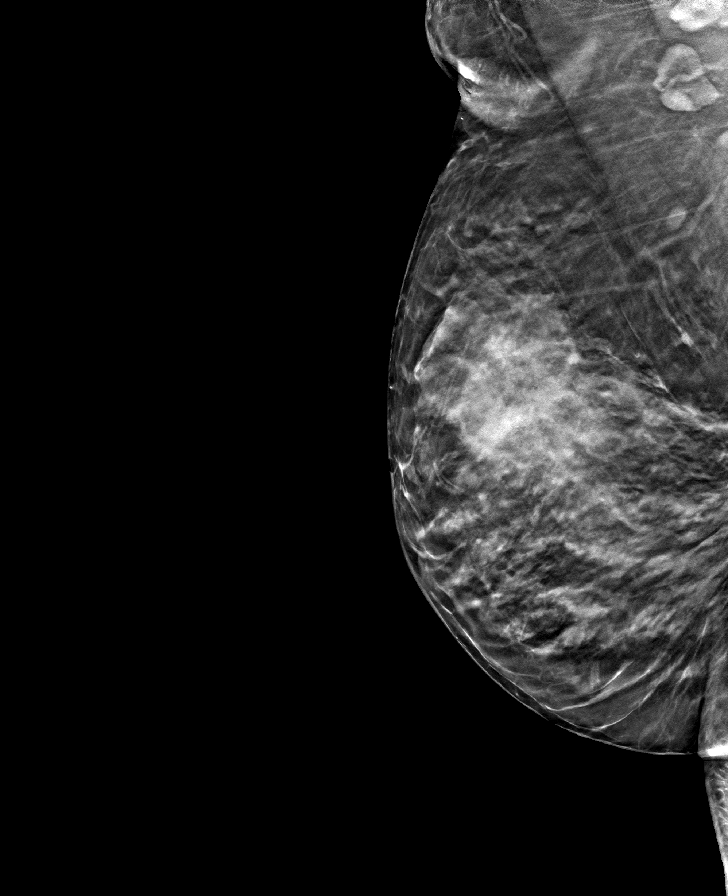

[R CC tomo · tomo slice 25/50.0]
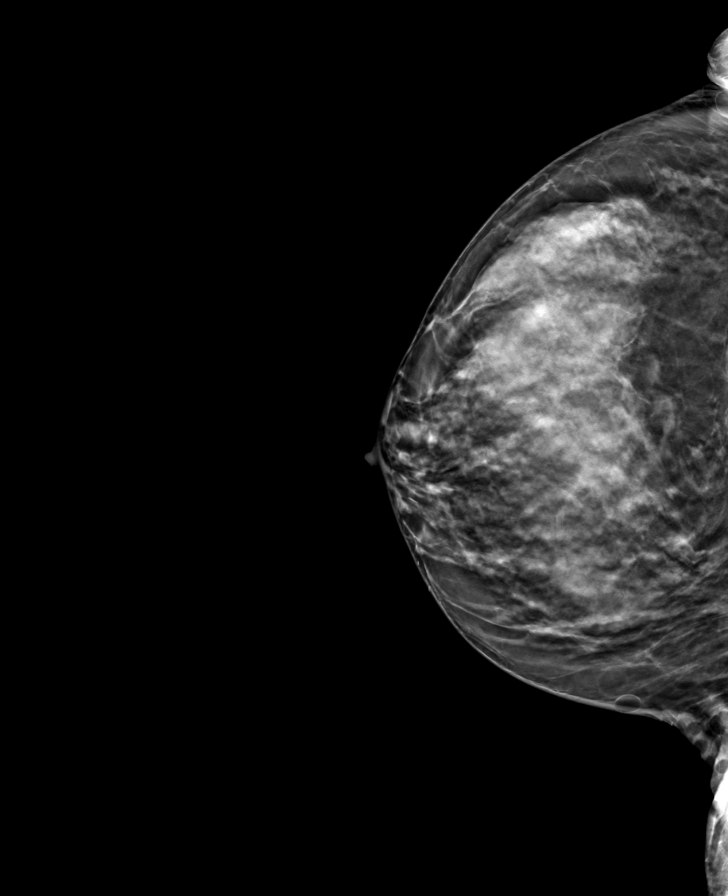

[L MLO tomo · tomo slice 23/45.0]
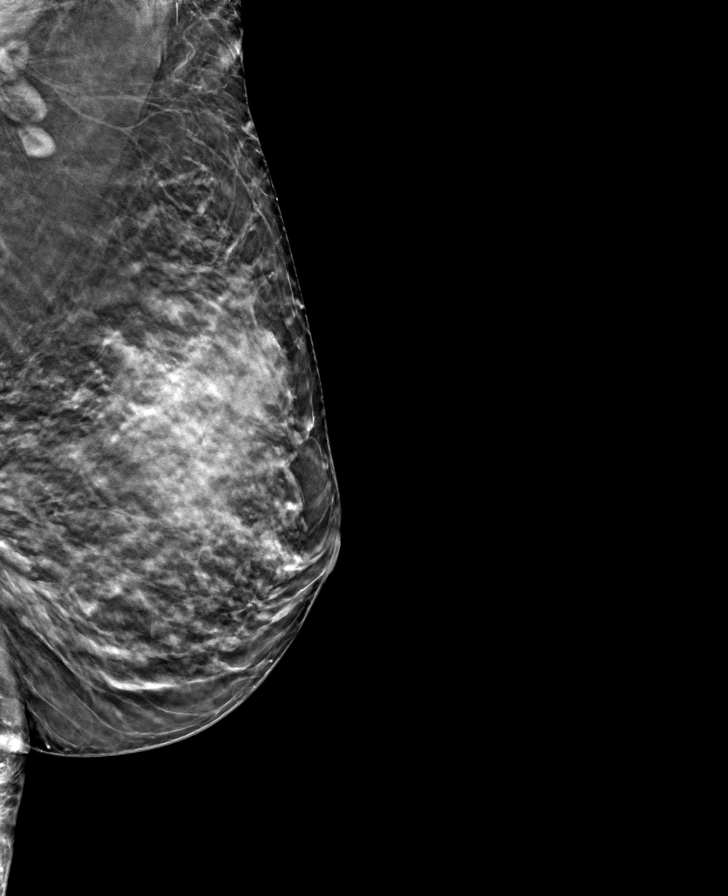

[L CC tomo · tomo slice 21/42.0]
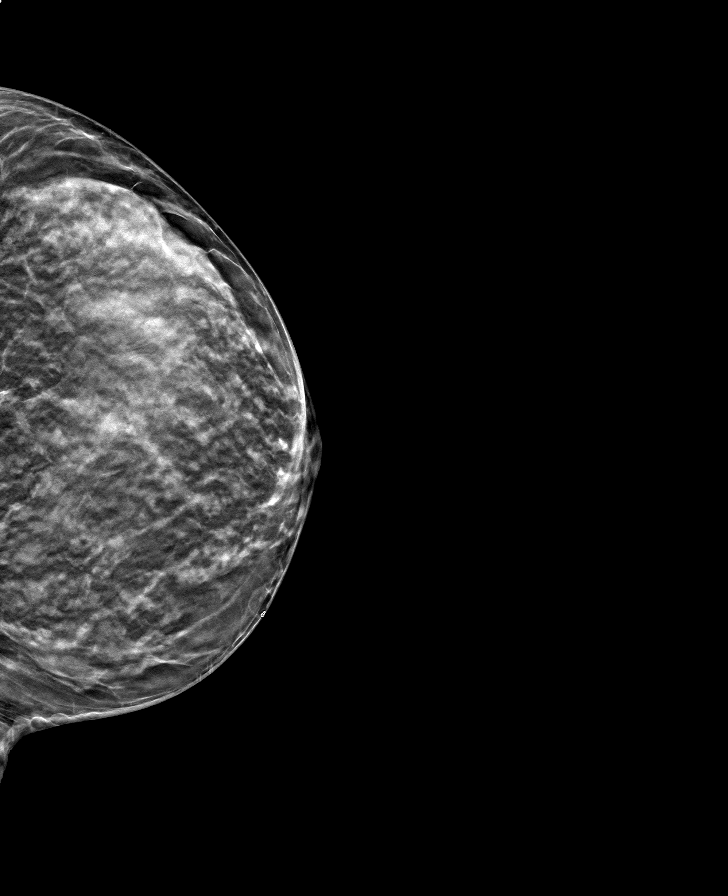

[8 of 24 positions shown; findings below may reference images not displayed]

ACR Breast Density Category c: The breast tissue is heterogeneously
dense, which may obscure small masses.
FINDINGS: There are no findings suspicious for malignancy. Images were
processed with CAD.
IMPRESSION: No mammographic evidence of malignancy. A result letter of this
screening mammogram will be mailed directly to the patient.

RECOMMENDATION:
Screening mammogram in one year. (Code:FT-U-LHB)

BI-RADS CATEGORY  1: Negative.

## 2023-06-21 ENCOUNTER — Encounter (HOSPITAL_COMMUNITY): Payer: Self-pay

## 2023-06-21 ENCOUNTER — Other Ambulatory Visit: Payer: Self-pay

## 2023-06-21 ENCOUNTER — Emergency Department (HOSPITAL_COMMUNITY)

## 2023-06-21 ENCOUNTER — Emergency Department (HOSPITAL_COMMUNITY)
Admission: EM | Admit: 2023-06-21 | Discharge: 2023-06-22 | Disposition: A | Discharge status: Home / Self Care | Attending: Emergency Medicine | Admitting: Emergency Medicine

## 2023-06-21 DIAGNOSIS — Z79899 Other long term (current) drug therapy: Secondary | ICD-10-CM | POA: Diagnosis not present

## 2023-06-21 DIAGNOSIS — I10 Essential (primary) hypertension: Secondary | ICD-10-CM | POA: Diagnosis not present

## 2023-06-21 DIAGNOSIS — R112 Nausea with vomiting, unspecified: Secondary | ICD-10-CM | POA: Diagnosis present

## 2023-06-21 DIAGNOSIS — R002 Palpitations: Secondary | ICD-10-CM | POA: Diagnosis not present

## 2023-06-21 DIAGNOSIS — E871 Hypo-osmolality and hyponatremia: Secondary | ICD-10-CM | POA: Insufficient documentation

## 2023-06-21 DIAGNOSIS — R202 Paresthesia of skin: Secondary | ICD-10-CM | POA: Diagnosis not present

## 2023-06-21 DIAGNOSIS — M542 Cervicalgia: Secondary | ICD-10-CM | POA: Insufficient documentation

## 2023-06-21 DIAGNOSIS — K7689 Other specified diseases of liver: Secondary | ICD-10-CM | POA: Diagnosis not present

## 2023-06-21 LAB — CBC WITH DIFFERENTIAL/PLATELET
Abs Immature Granulocytes: 0.03 10*3/uL (ref 0.00–0.07)
Basophils Absolute: 0 10*3/uL (ref 0.0–0.1)
Basophils Relative: 0 %
Eosinophils Absolute: 0 10*3/uL (ref 0.0–0.5)
Eosinophils Relative: 0 %
HCT: 44 % (ref 36.0–46.0)
Hemoglobin: 13.8 g/dL (ref 12.0–15.0)
Immature Granulocytes: 0 %
Lymphocytes Relative: 15 %
Lymphs Abs: 1.4 10*3/uL (ref 0.7–4.0)
MCH: 27.8 pg (ref 26.0–34.0)
MCHC: 31.4 g/dL (ref 30.0–36.0)
MCV: 88.7 fL (ref 80.0–100.0)
Monocytes Absolute: 0.5 10*3/uL (ref 0.1–1.0)
Monocytes Relative: 5 %
Neutro Abs: 7.4 10*3/uL (ref 1.7–7.7)
Neutrophils Relative %: 80 %
Platelets: 302 10*3/uL (ref 150–400)
RBC: 4.96 MIL/uL (ref 3.87–5.11)
RDW: 14 % (ref 11.5–15.5)
WBC: 9.3 10*3/uL (ref 4.0–10.5)
nRBC: 0 % (ref 0.0–0.2)

## 2023-06-21 LAB — COMPREHENSIVE METABOLIC PANEL WITH GFR
ALT: 24 U/L (ref 0–44)
AST: 21 U/L (ref 15–41)
Albumin: 3.6 g/dL (ref 3.5–5.0)
Alkaline Phosphatase: 79 U/L (ref 38–126)
Anion gap: 7 (ref 5–15)
BUN: 14 mg/dL (ref 6–20)
CO2: 23 mmol/L (ref 22–32)
Calcium: 9.4 mg/dL (ref 8.9–10.3)
Chloride: 102 mmol/L (ref 98–111)
Creatinine, Ser: 0.73 mg/dL (ref 0.44–1.00)
GFR, Estimated: >60 mL/min (ref >60)
Glucose, Bld: 121 mg/dL — ABNORMAL HIGH (ref 70–99)
Potassium: 4.1 mmol/L (ref 3.5–5.1)
Sodium: 132 mmol/L — ABNORMAL LOW (ref 135–145)
Total Bilirubin: 1.7 mg/dL — ABNORMAL HIGH (ref 0.0–1.2)
Total Protein: 7.4 g/dL (ref 6.5–8.1)

## 2023-06-21 LAB — TROPONIN I (HIGH SENSITIVITY): Troponin I (High Sensitivity): <2 ng/L (ref <18)

## 2023-06-21 LAB — LIPASE, BLOOD: Lipase: 24 U/L (ref 11–51)

## 2023-06-21 NOTE — ED Triage Notes (Signed)
 Right sided neck pain that started yesterday that causes tingling and numbness in right arm. C/o right upper quadrant pain. N/V, no diarrhea.

## 2023-06-21 NOTE — ED Provider Triage Note (Signed)
 Emergency Medicine Provider Triage Evaluation Note  [name] , a 58 y.o. female  was evaluated in triage.  Pt complains of headache that started last night, now resolved.  Now reporting pain in her right side of the neck that travels down to the right arm.  Also associated right arm numbness.  She also reports pain in the right side of her abdomen.  No unilateral weakness or slurred speech.  Denies any chest pain or shortness of breath.  Review of Systems  Positive: As above Negative: As above  Physical Exam  BP (!) 112/100 (BP Location: Left Arm)   Pulse 94   Temp 98.8 F (37.1 C) (Oral)   Resp 17   LMP 11/01/2016   SpO2 100%  Gen:   Awake, no distress   Resp:  Normal effort  MSK:   Moves extremities without difficulty  Other:  Smile symmetrically, raises eyebrows symmetrically.  No drift of the upper extremities.  5/5 strength of the upper extremities  Medical Decision Making  Medically screening exam initiated at 9:33 PM.  Appropriate orders placed.  [name] was informed that the remainder of the evaluation will be completed by another provider, this initial triage assessment does not replace that evaluation, and the importance of remaining in the ED until their evaluation is complete.     Rexie Catena, PA-C 06/21/23 2134

## 2023-06-22 ENCOUNTER — Emergency Department (HOSPITAL_COMMUNITY)

## 2023-06-22 ENCOUNTER — Encounter (HOSPITAL_COMMUNITY): Payer: Self-pay

## 2023-06-22 LAB — TROPONIN I (HIGH SENSITIVITY): Troponin I (High Sensitivity): <2 ng/L (ref <18)

## 2023-06-22 MED ORDER — ONDANSETRON HCL 4 MG/2ML IJ SOLN
4.0000 mg | Freq: Once | INTRAMUSCULAR | Status: AC
  Administered 2023-06-22: 4 mg via INTRAVENOUS
  Filled 2023-06-22: qty 2

## 2023-06-22 MED ORDER — MORPHINE SULFATE (PF) 4 MG/ML IV SOLN
4.0000 mg | Freq: Once | INTRAVENOUS | Status: AC
  Administered 2023-06-22: 4 mg via INTRAVENOUS
  Filled 2023-06-22: qty 1

## 2023-06-22 MED ORDER — IOHEXOL 300 MG/ML  SOLN
80.0000 mL | Freq: Once | INTRAMUSCULAR | Status: AC | PRN
  Administered 2023-06-22: 80 mL via INTRAVENOUS

## 2023-06-22 MED ORDER — GADOBUTROL 1 MMOL/ML IV SOLN
5.0000 mL | Freq: Once | INTRAVENOUS | Status: AC | PRN
  Administered 2023-06-22: 5 mL via INTRAVENOUS

## 2023-06-22 NOTE — ED Provider Notes (Addendum)
 [name] Provider Note   CSN: [id number] Arrival date & time: 06/21/23  2107     History  Chief Complaint  Patient presents with   Neck Pain    [name] is a 58 y.o. female with PMHx HTN, anemia, fibroids who presents to ED concerned for RUQ abdominal pain since yesterday. Patient also with nausea yesterday which progressed to 3 episodes of vomiting today. Last BM today.  Of note, patient complaining of right sided neck pain today with intermittent paresthesias in right arm. Patient has not taken any OTC medications for her symptoms today. Patient also endorsing some vague palpitations today and was concerned about her BP.  Denies fever, diarrhea, dysuria, hematuria, hematochezia.    Neck Pain      Home Medications Prior to Admission medications   Medication Sig Start Date End Date Taking? Authorizing Provider  acetaminophen  (TYLENOL ) 325 MG tablet Take 650 mg by mouth as needed for mild pain.    [provider]  amLODipine  (NORVASC ) 10 MG tablet Take 1 tablet (10 mg total) by mouth daily. 06/21/17   Dove, Myra C, MD  estradiol  (ESTRACE ) 1 MG tablet Take 2 tablets (2 mg total) by mouth daily. 11/02/17   Dove, Myra C, MD  hydrochlorothiazide (HYDRODIURIL) 25 MG tablet Take 25 mg by mouth daily.    [provider]  spironolactone  (ALDACTONE ) 25 MG tablet TAKE 1 TABLET BY MOUTH EVERY DAY 09/07/19   Patwardhan, Kaye Parsons, MD      Allergies    Patient has no known allergies.    Review of Systems   Review of Systems  Musculoskeletal:  Positive for neck pain.    Physical Exam Updated Vital Signs BP 121/74   Pulse 60   Temp 98.1 F (36.7 C)   Resp 18   Ht 5' (1.524 m)   Wt 52.2 kg   LMP 11/01/2016   SpO2 100%   BMI 22.46 kg/m  Physical Exam Vitals and nursing note reviewed.  Constitutional:      General: She is not in acute distress.    Appearance: She is not ill-appearing or toxic-appearing.   HENT:     Head: Normocephalic and atraumatic.     Mouth/Throat:     Mouth: Mucous membranes are moist.  Eyes:     General: No scleral icterus.       Right eye: No discharge.        Left eye: No discharge.     Conjunctiva/sclera: Conjunctivae normal.  Cardiovascular:     Rate and Rhythm: Normal rate and regular rhythm.     Pulses: Normal pulses.     Heart sounds: Normal heart sounds. No murmur heard. Pulmonary:     Effort: Pulmonary effort is normal. No respiratory distress.     Breath sounds: Normal breath sounds. No wheezing, rhonchi or rales.  Abdominal:     General: Abdomen is flat. Bowel sounds are normal. There is no distension.     Palpations: Abdomen is soft. There is no mass.     Tenderness: There is abdominal tenderness.     Comments: RUQ tenderness to palpation  Musculoskeletal:     Right lower leg: No edema.     Left lower leg: No edema.  Skin:    General: Skin is warm and dry.     Findings: No rash.  Neurological:     General: No focal deficit present.     Mental Status: She is  alert and oriented to person, place, and time. Mental status is at baseline.  Psychiatric:        Mood and Affect: Mood normal.        Behavior: Behavior normal.     ED Results / Procedures / Treatments   Labs (all labs ordered are listed, but only abnormal results are displayed) Labs Reviewed  COMPREHENSIVE METABOLIC PANEL WITH GFR - Abnormal; Notable for the following components:      Result Value   Sodium 132 (*)    Glucose, Bld 121 (*)    Total Bilirubin 1.7 (*)    All other components within normal limits  CBC WITH DIFFERENTIAL/PLATELET  LIPASE, BLOOD  TROPONIN I (HIGH SENSITIVITY)  TROPONIN I (HIGH SENSITIVITY)    EKG EKG Interpretation Date/Time:  Monday Jun 21 2023 21:38:06 EDT Ventricular Rate:  91 PR Interval:  107 QRS Duration:  78 QT Interval:  350 QTC Calculation: 431 R Axis:   77  Text Interpretation: Sinus rhythm Confirmed by Maralee Senate, April ([id number]) on  06/22/2023 1:46:09 AM  Radiology CT ABDOMEN PELVIS W CONTRAST Result Date: 06/22/2023 CLINICAL DATA:  Right upper quadrant pain EXAM: CT ABDOMEN AND PELVIS WITH CONTRAST TECHNIQUE: Multidetector CT imaging of the abdomen and pelvis was performed using the standard protocol following bolus administration of intravenous contrast. RADIATION DOSE REDUCTION: This exam was performed according to the departmental dose-optimization program which includes automated exposure control, adjustment of the mA and/or kV according to patient size and/or use of iterative reconstruction technique. CONTRAST:  80mL OMNIPAQUE IOHEXOL 300 MG/ML  SOLN COMPARISON:  Right upper quadrant ultrasound from earlier today FINDINGS: Lower chest: Streaky and hazy density at the lung bases attributed atelectasis. Hepatobiliary: Multiple hepatic cysts. Distended, rounded cystic density in the upper central liver measures up to 13 cm and has hazy patchy high-density with sin mural calcification anteriorly. Cystic tumor is not excluded. The mass causes extrinsic narrowing of the hepatic cava. This lesion has diffuse mid level echoes without internal color Doppler flow. There are numerous other liver cysts which have a simple appearance, including a cysts contiguous with the gallbladder measuring 5.7 cm. Hypervascular nodule with arteriovenous continuity in the lateral segment left lobe liver measuring 2 cm, consistent with shunt. Smaller portal venous shunt suggested in the central right liver on 2:32. No subcapsular hematoma.No evidence of biliary obstruction or stone. Pancreas: Unremarkable. Spleen: Unremarkable. Adrenals/Urinary Tract: Negative adrenals. No hydronephrosis or stone. 3.9 cm right renal cyst with simple appearance unremarkable bladder. Stomach/Bowel: No obstruction or bowel wall thickening. No appendicitis. Vascular/Lymphatic: No acute vascular abnormality. No mass or adenopathy. Mild mesenteric haziness likely secondary to the ascites  Reproductive:Hysterectomy. Other: Small volume ascites mainly in the deep pendant spaces which is low-density. No hemoperitoneum detected. Musculoskeletal: No acute abnormalities. IMPRESSION: 1. 13 cm complex cyst in the upper ventral liver, complexity underestimated relative to preceding right upper quadrant ultrasound. There are some patchy high-density areas and acute intracystic hemorrhage is possible in the setting of acute pain. Recommend liver MRI with contrast. 2. Multiple additional renal liver cysts and at least 2 shunts. 3. Small volume ascites from uncertain source. No hemoperitoneum to relate with #1. Electronically Signed   By: Ronnette Coke M.D.   On: 06/22/2023 05:46   US  Abdomen Limited RUQ (LIVER/GB) Result Date: 06/22/2023 CLINICAL DATA:  Right upper quadrant pain EXAM: ULTRASOUND ABDOMEN LIMITED RIGHT UPPER QUADRANT COMPARISON:  None Available. FINDINGS: Gallbladder: No gallstones or gallbladder wall thickening. Pericholecystic fluid is likely due to ascites. Negative  sonographic Murphy sign noted by the sonographer. Common bile duct: Diameter: 4 mm.  No intrahepatic biliary dilation. Liver: Slight nodular contour of the liver suggesting cirrhosis. Heterogenous mass occupying much of the right hepatic lobe measures 12.7 x 9.2 x 30.4 cm. There are additional cystic masses measuring up to 6.6 cm. These are indeterminate in the setting of cirrhosis and liver protocol MRI is recommended for further evaluation. Portal vein is patent on color Doppler imaging with normal direction of blood flow towards the liver. Other: None. IMPRESSION: 1. No evidence of acute cholecystitis. 2. Multiple masses within the liver. The largest is heterogenous and measures 13.4 cm. This is concerning for malignancy. Hepatic protocol MRI is recommended. 3. Slight nodular contour suggesting cirrhosis with perihepatic ascites. Electronically Signed   By: Rozell Cornet M.D.   On: 06/22/2023 02:50   CT Head Wo  Contrast Result Date: 06/21/2023 CLINICAL DATA:  Right arm numbness. EXAM: CT HEAD WITHOUT CONTRAST TECHNIQUE: Contiguous axial images were obtained from the base of the skull through the vertex without intravenous contrast. RADIATION DOSE REDUCTION: This exam was performed according to the departmental dose-optimization program which includes automated exposure control, adjustment of the mA and/or kV according to patient size and/or use of iterative reconstruction technique. COMPARISON:  None Available. FINDINGS: Brain: No evidence of acute infarction, hemorrhage, hydrocephalus, extra-axial collection or mass lesion/mass effect. Vascular: No hyperdense vessel or unexpected calcification. Skull: Normal. Negative for fracture or focal lesion. Sinuses/Orbits: No acute finding. Other: None. IMPRESSION: No acute intracranial abnormality. Electronically Signed   By: Tyron Gallon M.D.   On: 06/21/2023 22:32    Procedures Procedures    Medications Ordered in ED Medications  morphine (PF) 4 MG/ML injection 4 mg (4 mg Intravenous Given 06/22/23 0207)  ondansetron  (ZOFRAN ) injection 4 mg (4 mg Intravenous Given 06/22/23 0207)  iohexol (OMNIPAQUE) 300 MG/ML solution 80 mL (80 mLs Intravenous Contrast Given 06/22/23 0453)  morphine (PF) 4 MG/ML injection 4 mg (4 mg Intravenous Given 06/22/23 2440)    ED Course/ Medical Decision Making/ A&P Clinical Course as of 06/22/23 2218  Tue Jun 22, 2023  0657 MRI follow up and talk to GI or surgery. [WF]  0948 Abd pain and neck pain began Sunday/Monday.   No etoh, no trauma to abd.  [WF]  [phone] IMPRESSION: 1. Multifocal hepatic cystic foci, the largest of which is centered in segment 4/8 and favored to reflect a complex hepatic cyst with internal hemorrhage. 2. Additional cystic foci throughout the liver, at least one of which contains multiple thin internal septations, located within segment 2 measuring 2.4 x 1.7 cm. No definite mural nodularity or abnormal  enhancement. 3. 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 No main ductal dilation, mass lesion, or abnormal enhancement. Recommend follow up pre and post contrast MRI/MRCP in 1 year.   [WF]  1109 10:43 am updated by triage secretary that [facility] is in a procedure.  [WF]    Clinical Course User Index [WF] Coretta Dexter, [place]                                 Medical Decision Making Amount and/or Complexity of Data Reviewed Radiology: ordered.  Risk Prescription drug management.    This patient presents to the ED for concern of abdominal pain, this involves an extensive number  of treatment options, and is a complaint that carries with it a high risk of complications and morbidity.  The differential diagnosis includes gastroenteritis, colitis, small bowel obstruction, appendicitis, cholecystitis, pancreatitis, nephrolithiasis, UTI, pyleonephritis,   Co morbidities that complicate the patient evaluation  HTN, anemia, fibroids    Additional history obtained:  Dr. Alayne Hubert PCP    Problem List / ED Course / Critical interventions / Medication management  Patient presented for RUQ abdominal pain and vomiting.  Physical exam with RUQ tenderness to palpation.  Rest of physical exam reassuring.  Patient afebrile with stable vitals. I Ordered, and personally interpreted labs.  CBC leukocytosis or anemia. I ordered imaging studies including CT Abd/Pelvis with contrast/RUQ US . I independently visualized and interpreted imaging and I agree with the radiologist interpretation of possible acute intracystic hemorrhage. I personally viewed and interpreted the EKG/cardiac monitored which showed an underlying rhythm of: Sinus rhythm CT recommending MRI liver which I have ordered. I have reviewed the patients home medicines and have made adjustments as needed   Social  Determinants of Health:  none  6:30PM Care of DAUREEN WASSELL  transferred to [facility] at the end of my shift as the patient will require reassessment once labs/imaging have resulted. Patient presentation, ED course, and plan of care discussed with review of all pertinent labs and imaging. Please see his/her note for further details regarding further ED course and disposition. Plan at time of handoff is reassess patient after MRI liver. Patient might need consult with general surgery or GI. This may be altered or completely changed at the discretion of the oncoming team pending results of further workup.          Final Clinical Impression(s) / ED Diagnoses Final diagnoses:  None    Rx / DC Orders ED Discharge Orders     None         Don Fritter 06/22/23 1610    Palumbo, April, MD 06/22/23 0708   As for patient's neck pain. She had tenderness of the right trapezius muscle. It appears that the neck pain is MSK related and patient and I believe that she pulled a muscle while vomiting earlier today.   [place], [place] 06/22/23 2219    Maralee Senate, April, MD 06/22/23 2304

## 2023-06-22 NOTE — Discharge Instructions (Addendum)
 I have talked to Dr. Lavaughn Portland who is one of our gastroenterologist. He is aware of your case.  Please call to make an appoint with him via the number that I provided you.  I have also given you the information for an interventional radiologist these are doctors who can do procedures such as biopsy (sampling) etc.  If you develop any significant worsening in your abdominal pain that is consistent please return to the emergency room for reevaluation.  Please make sure not to take any aspirin or ibuprofen .  Tylenol  1000 mg every 6 hours as needed for pain.

## 2023-06-22 NOTE — ED Provider Notes (Signed)
 Accepted handoff at shift change from SM PA-C. Please see prior provider note for more detail.   Briefly: Patient is 58 y.o.   "[name] is a 58 y.o. female with PMHx HTN, anemia, fibroids who presents to ED concerned for RUQ abdominal pain since yesterday. Patient also with nausea yesterday which progressed to 3 episodes of vomiting today. Last BM today.   Of note, patient complaining of right sided neck pain today with intermittent paresthesias in right arm. Patient has not taken any OTC medications for her symptoms today. Patient also endorsing some vague palpitations today and was concerned about her BP.   Denies fever, diarrhea, dysuria, hematuria, hematochezia."     DDX: concern for RUQ abd pain, chole, hepatocellular carcinoma  Plan: FU on MRI     Physical Exam  BP 109/80   Pulse 71   Temp 98.4 F (36.9 C) (Oral)   Resp 15   Ht 5' (1.524 m)   Wt 52.2 kg   LMP 11/01/2016   SpO2 99%   BMI 22.46 kg/m   Physical Exam Vitals and nursing note reviewed.  Constitutional:      General: She is not in acute distress. HENT:     Head: Normocephalic and atraumatic.     Nose: Nose normal.  Eyes:     General: No scleral icterus. Cardiovascular:     Rate and Rhythm: Normal rate and regular rhythm.     Pulses: Normal pulses.     Heart sounds: Normal heart sounds.  Pulmonary:     Effort: Pulmonary effort is normal. No respiratory distress.     Breath sounds: No wheezing.  Abdominal:     Palpations: Abdomen is soft.     Tenderness: There is abdominal tenderness in the right upper quadrant.  Musculoskeletal:     Cervical back: Normal range of motion.     Right lower leg: No edema.     Left lower leg: No edema.  Skin:    General: Skin is warm and dry.     Capillary Refill: Capillary refill takes less than 2 seconds.  Neurological:     Mental Status: She is alert. Mental status is at baseline.  Psychiatric:        Mood and Affect: Mood normal.        Behavior:  Behavior normal.     Procedures  Procedures  ED Course / MDM   Clinical Course as of 06/22/23 1256  Tue Jun 22, 2023  1610 MRI follow up and talk to GI or surgery. [WF]  0948 Abd pain and neck pain began Sunday/Monday.   No etoh, no trauma to abd.  [WF]  [phone] IMPRESSION: 1. Multifocal hepatic cystic foci, the largest of which is centered in segment 4/8 and favored to reflect a complex hepatic cyst with internal hemorrhage. 2. Additional cystic foci throughout the liver, at least one of which contains multiple thin internal septations, located within segment 2 measuring 2.4 x 1.7 cm. No definite mural nodularity or abnormal enhancement. 3. 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 No main ductal dilation, mass lesion, or abnormal enhancement. Recommend follow up pre and post contrast MRI/MRCP in 1 year.   [WF]  1109 10:43 am updated by triage secretary that [facility] is in a procedure.  [WF]    Clinical Course User Index [WF] Coretta Dexter, PA   Medical Decision Making  Amount and/or Complexity of Data Reviewed Radiology: ordered.  Risk Prescription drug management.   This patient presents to the ED for concern of abd pain, this involves a number of treatment options, and is a complaint that carries with it a moderate risk of complications and morbidity. A differential diagnosis was considered for the patient's symptoms which is discussed below:   The causes of generalized abdominal pain include but are not limited to AAA, mesenteric ischemia, appendicitis, diverticulitis, DKA, gastritis, gastroenteritis, AMI, nephrolithiasis, pancreatitis, peritonitis, adrenal insufficiency,lead poisoning, iron toxicity, intestinal ischemia, constipation, UTI,SBO/LBO, splenic rupture, biliary disease, IBD, IBS, PUD, or hepatitis.   Co morbidities: Discussed in  HPI   Brief History:  "[name] is a 58 y.o. female with PMHx HTN, anemia, fibroids who presents to ED concerned for RUQ abdominal pain since yesterday. Patient also with nausea yesterday which progressed to 3 episodes of vomiting today. Last BM today.   Of note, patient complaining of right sided neck pain today with intermittent paresthesias in right arm. Patient has not taken any OTC medications for her symptoms today. Patient also endorsing some vague palpitations today and was concerned about her BP.   Denies fever, diarrhea, dysuria, hematuria, hematochezia."    EMR reviewed including pt PMHx, past surgical history and past visits to ER.   See HPI for more details   Lab Tests:  CBC without leukocytosis, no anemia, troponin normal, lipase normal, CMP with mildly low sodium.  Otherwise normal.    Imaging Studies:  Abnormal findings. I personally reviewed all imaging studies. Imaging notable for  MRI IMPRESSION:  1. Multifocal hepatic cystic foci, the largest of which is centered  in segment 4/8 and favored to reflect a complex hepatic cyst with  internal hemorrhage.  2. Additional cystic foci throughout the liver, at least one of  which contains multiple thin internal septations, located within  segment 2 measuring 2.4 x 1.7 cm. No definite mural nodularity or  abnormal enhancement.  3. T2 hyperintense cystic foci in the pancreatic neck/body measuring  6 mm and tail measuring 3 mm, likely small side branch intraductal  papillary mucinous neoplasms (IPMN). No main ductal dilation, mass  lesion, or abnormal enhancement. Recommend follow up pre and post  contrast MRI/MRCP in 1 year.     Cardiac Monitoring:  The patient was maintained on a cardiac monitor.  I personally viewed and interpreted the cardiac monitored which showed an underlying rhythm of: nsr EKG non-ischemic   Medicines ordered:  I did not personally order any pain medication.  She received  morphine just prior to 7 AM.  She has been pain-free for the entirety of her time in my care.   Critical Interventions:     Consults/Attending Physician   I discussed this case with my attending physician who cosigned this note including patient's presenting symptoms, physical exam, and planned diagnostics and interventions. Attending physician stated agreement with plan or made changes to plan which were implemented.  Discussed with interventional radiology who recommended outpatient follow-up with them.  They reviewed the images and did not see any active bleeding  Also discussed with Dr. Lavaughn Portland of gastroenterology who agrees with outpatient follow-up.  Reevaluation:  After the interventions noted above I re-evaluated patient and found that they have :resolved   Social Determinants of Health:      Problem List / ED Course:  Patient has multiple hepatic cysts, no history of alcohol use, not on any anticoagulation, not taking aspirin or any Cox inhibitors.  No symptoms currently.  Tolerating p.o. feeling well enough to go home.  Discussed with interventional radiology and gastroenterology who will see her in outpatient follow-up.  I did provide patient with strict return precautions to the emergency room.  She understands that she is at higher risk for bleeding into her abdomen.   Dispostion:  After consideration of the diagnostic results and the patients response to treatment, I feel that the patent would benefit from outpatient follow-up.       Ofelia Bent [place], [place] 06/22/23 1259    Deatra Face, MD 06/22/23 1438

## 2023-06-22 NOTE — ED Notes (Signed)
 pT @ CT

## 2023-06-23 ENCOUNTER — Other Ambulatory Visit: Payer: Self-pay | Admitting: Emergency Medicine

## 2023-06-23 DIAGNOSIS — K7689 Other specified diseases of liver: Secondary | ICD-10-CM

## 2023-07-07 NOTE — Progress Notes (Signed)
 This encounter was conducted via the [employer] providing interactive audio and visual communication. The patient provided verbal consent to conduct a virtual appointment.  The patient was located at their primary residence during this encounter.   Chief Complaint: Patient was seen in virtual video consultation today for hepatic cysts  Referring Physician(s): Fondaw,Wylder S  History of Present Illness: [name] is a 58 y.o. female with a medical history significant for HTN, anemia and uterine fibroids. She presented to the ED 06/22/23 with RUQ abdominal pain, nausea, vomiting, right-sided neck pain with intermittent paresthesias in the right arm and palpitations. Physical exam was positive for RUQ tenderness and labs showed a slightly elevated T. Bilirubin level of 1.7. Imaging obtained showed a multifocal hepatic cyst favored to reflect a complex cyst with internal hemorrhage. Additional cystic foci throughout the liver were also present. Interventional Radiology was consulted and our team recommended follow up at our outpatient clinic.   After her symptoms were appropriately treated she was discharged from the ED with outpatient GI and IR follow up. She presents today via virtual video visit to discuss potential hepatic cyst treatment options.  Her pain is much improved, however persistent.  She reports an average of 6/10 RUQ pain.  She is still taking tylenol  around the clock.  No fevers or chills, nausea or vomiting.  Pain is exacerbated with lifting and taking deep breaths.  She is following with Dr. Kimble Pennant.  Past Medical History:  Diagnosis Date   Anemia    History of blood transfusion 11/19/2016   4 units at [facility]   Hypertension    HYPERTENSION 01/06/2008   Qualifier: Diagnosis of  By: Rochelle Chu MD, Randa Burton.    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  Medications: Prior to Admission medications   Medication Sig Start Date End Date Taking? Authorizing Provider  acetaminophen  (TYLENOL ) 325 MG tablet Take 650 mg by mouth as needed for mild pain.    [provider]  amLODipine  (NORVASC ) 10 MG tablet Take 1 tablet (10 mg total) by mouth daily. 06/21/17   Dove, Myra C, MD  estradiol  (ESTRACE ) 1 MG tablet Take 2 tablets (2 mg total) by mouth daily. 11/02/17   Dove, Myra C, MD  hydrochlorothiazide (HYDRODIURIL) 25 MG tablet Take 25 mg by mouth daily.    [provider]  spironolactone  (ALDACTONE ) 25 MG tablet TAKE 1 TABLET BY MOUTH EVERY DAY 09/07/19   Patwardhan, Kaye Parsons, MD     Family History  Problem Relation Age of Onset   Hypertension Mother    Breast cancer Mother    Cancer Maternal Aunt        breast   Breast cancer Maternal Aunt    Stroke Father    Heart attack Maternal Grandfather        Age 52   Stroke Paternal Grandmother     Social History   Socioeconomic History   Marital status: Divorced    Spouse name: Not on file   Number of children: 2   Years of education: Not on file   Highest education level:  Not on file  Occupational History   Not on file  Tobacco Use   Smoking status: Never   Smokeless tobacco: Never  Vaping Use   Vaping status: Never Used  Substance and Sexual Activity   Alcohol use: No   Drug use: No   Sexual activity: Not Currently    Birth control/protection: Surgical  Other Topics Concern   Not on file  Social History Narrative   Pt is separated from current husband.   Social Drivers of Corporate investment banker Strain: Not on file  Food Insecurity: Not on file  Transportation Needs: Not on file  Physical Activity: Not on file  Stress: Not on file  Social Connections: Not on file    Review of  Systems: A 12 point ROS discussed and pertinent positives are indicated in the HPI above.  All other systems are negative.  Vital Signs: LMP 11/01/2016   Physical Exam  Patient is alert, oriented and able to participate fully in the conversation. No apparent discomfort or distress observed. She appears appropriately dressed.    Imaging:  MR Liver 06/22/23         Labs:  CBC: Recent Labs    06/21/23 2141  WBC 9.3  HGB 13.8  HCT 44.0  PLT 302    COAGS: No results for input(s): "INR", "APTT" in the last 8760 hours.  BMP: Recent Labs    06/21/23 2141  NA 132*  K 4.1  CL 102  CO2 23  GLUCOSE 121*  BUN 14  CALCIUM 9.4  CREATININE 0.73  GFRNONAA >60    LIVER FUNCTION TESTS: Recent Labs    06/21/23 2141  BILITOT 1.7*  AST 21  ALT 24  ALKPHOS 79  PROT 7.4  ALBUMIN 3.6    TUMOR MARKERS: No results for input(s): "AFPTM", "CEA", "CA199", "CHROMGRNA" in the last 8760 hours.  Assessment and Plan: 58 year old female with a history of symptomatic, benign appearing hepatic cysts, the largest in the right lobe with recent hemorrhage prompting emergency room evaluation.  She would be an excellent candidate for single session aspiration and sclerotherapy of the largest hemorrhagic cyst and the smaller cyst near the gallbladder.  We discussed the rationale, periprocedural expectations, and long term expected outcomes of hepatic cyst aspiration with sclerotherapy.  She would like to proceed.  Plan for ultrasound and fluoroscopic guided hepatic cyst aspiration and polidocanol sclerotherapy with moderate sedation at [facility].  Creasie Doctor, MD Pager: [phone]    I spent a total of  30 Minutes   in virtual video clinical consultation, greater than 50% of which was counseling/coordinating care for hepatic cysts.

## 2023-07-09 ENCOUNTER — Ambulatory Visit
Admission: RE | Admit: 2023-07-09 | Discharge: 2023-07-09 | Disposition: A | Discharge status: Home / Self Care | Source: Ambulatory Visit | Attending: Emergency Medicine | Admitting: Emergency Medicine

## 2023-07-09 DIAGNOSIS — K7689 Other specified diseases of liver: Secondary | ICD-10-CM

## 2023-07-09 HISTORY — PX: IR RADIOLOGIST EVAL & MGMT: IMG5224

## 2023-07-14 ENCOUNTER — Other Ambulatory Visit (HOSPITAL_COMMUNITY): Payer: Self-pay | Admitting: Interventional Radiology

## 2023-07-14 DIAGNOSIS — K7689 Other specified diseases of liver: Secondary | ICD-10-CM

## 2023-08-09 ENCOUNTER — Other Ambulatory Visit: Payer: Self-pay | Admitting: Radiology

## 2023-08-09 DIAGNOSIS — K7689 Other specified diseases of liver: Secondary | ICD-10-CM

## 2023-08-09 NOTE — H&P (Signed)
 Chief Complaint: Symptomatic hepatic cysts; referred for image guided hepatic cyst(s) aspiration/sclerotherapy  Referring Provider(s): Fondaw,W  Supervising Physician: Jennefer Rover  Patient Status: [facility] - Out-pt  History of Present Illness: [name] is a 58 y.o. female with PMH sig for anemia, HTN, uterine fibroids with prior hysterectomy, artificial left eye and symptomatic benign-appearing hepatic cysts, largest in the right lobe with recent hemorrhage. MRI abd dated 06/22/23 revealed:    1. Multifocal hepatic cystic foci, the largest of which is centered in segment 4/8 and favored to reflect a complex hepatic cyst with internal hemorrhage. 2. Additional cystic foci throughout the liver, at least one of which contains multiple thin internal septations, located within segment 2 measuring 2.4 x 1.7 cm. No definite mural nodularity or abnormal enhancement. 3. 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 No main ductal dilation, mass lesion, or abnormal enhancement. Recommend follow up pre and post contrast MRI/MRCP in 1 year.  Patient underwent consultation with Dr. Jennefer on 07/09/2023 to discuss treatment options for these symptomatic hepatic cysts and was deemed an appropriate candidate for single session aspiration and sclerotherapy of the largest hemorrhagic cyst and the smaller cyst near the gallbladder with moderate sedation.  She presents today for the procedure.   Patient is Full Code  Past Medical History:  Diagnosis Date   Anemia    History of blood transfusion 11/19/2016   4 units at [facility]   Hypertension    HYPERTENSION 01/06/2008   Qualifier: Diagnosis of  By: Joshua MD, Debby CROME.    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  Medications: Prior to Admission medications   Medication Sig Start Date End Date Taking? Authorizing Provider  acetaminophen  (TYLENOL ) 325 MG tablet Take 650 mg by mouth as needed for mild pain.    [provider]  amLODipine  (NORVASC ) 10 MG tablet Take 1 tablet (10 mg total) by mouth daily. 06/21/17   Dove, Myra C, MD  estradiol  (ESTRACE ) 1 MG tablet Take 2 tablets (2 mg total) by mouth daily. 11/02/17   Dove, Myra C, MD  hydrochlorothiazide (HYDRODIURIL) 25 MG tablet Take 25 mg by mouth daily.    [provider]  spironolactone  (ALDACTONE ) 25 MG tablet TAKE 1 TABLET BY MOUTH EVERY DAY 09/07/19   Patwardhan, Newman PARAS, MD     Family History  Problem Relation Age of Onset   Hypertension Mother    Breast cancer Mother    Cancer Maternal Aunt        breast   Breast cancer Maternal Aunt    Stroke Father    Heart attack Maternal Grandfather        Age 106   Stroke Paternal Grandmother     Social History   Socioeconomic History   Marital status: Divorced    Spouse name: Not on file   Number of children: 2   Years of education: Not on file   Highest education level: Not  on file  Occupational History   Not on file  Tobacco Use   Smoking status: Never   Smokeless tobacco: Never  Vaping Use   Vaping status: Never Used  Substance and Sexual Activity   Alcohol use: No   Drug use: No   Sexual activity: Not Currently    Birth control/protection: Surgical  Other Topics Concern   Not on file  Social History Narrative   Pt is separated from current husband.   Social Drivers of Corporate investment banker Strain: Not on file  Food Insecurity: Not on file  Transportation Needs: Not on file  Physical Activity: Not on file  Stress: Not on file   Social Connections: Not on file       Review of Systems denies fever,HA,CP,dyspnea, cough, vomiting or bleeding; she does have some abd fullness, occ nausea, back pain  Vital Signs: Vitals:   08/10/23 1140  BP: (!) 113/92  Pulse: 78  Resp: 18  Temp: 98.7 F (37.1 C)  SpO2: 98%    LMP 11/01/2016   Advance Care Plan: No documents on file  Physical Exam; awake/alert; chest- CTA bilat; heart- RRR; abd-soft,+BS, some fullness RUQ; no sig LE edema  Imaging: No results found.  Labs:  CBC: Recent Labs    06/21/23 2141  WBC 9.3  HGB 13.8  HCT 44.0  PLT 302    COAGS: No results for input(s): INR, APTT in the last 8760 hours.  BMP: Recent Labs    06/21/23 2141  NA 132*  K 4.1  CL 102  CO2 23  GLUCOSE 121*  BUN 14  CALCIUM 9.4  CREATININE 0.73  GFRNONAA >60    LIVER FUNCTION TESTS: Recent Labs    06/21/23 2141  BILITOT 1.7*  AST 21  ALT 24  ALKPHOS 79  PROT 7.4  ALBUMIN 3.6    TUMOR MARKERS: No results for input(s): AFPTM, CEA, CA199, CHROMGRNA in the last 8760 hours.  Assessment and Plan: 58 y.o. female with PMH sig for anemia, HTN, uterine fibroids with prior hysterectomy, artificial left eye and symptomatic benign-appearing hepatic cysts, largest in the right lobe with recent hemorrhage. MRI abd dated 06/22/23 revealed:    1. Multifocal hepatic cystic foci, the largest of which is centered in segment 4/8 and favored to reflect a complex hepatic cyst with internal hemorrhage. 2. Additional cystic foci throughout the liver, at least one of which contains multiple thin internal septations, located within segment 2 measuring 2.4 x 1.7 cm. No definite mural nodularity or abnormal enhancement. 3. 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 No main ductal dilation, mass lesion, or abnormal enhancement. Recommend  follow up pre and post contrast MRI/MRCP in 1 year.  Patient underwent consultation with Dr. Jennefer on 07/09/2023 to discuss treatment options for these symptomatic hepatic cysts and was deemed an appropriate candidate for single session aspiration and sclerotherapy of the largest hemorrhagic cyst and the smaller cyst near the gallbladder with moderate sedation.  She presents today for the procedure.Risks and benefits of procedure was discussed with the patient  including, but not limited to bleeding, infection, damage to adjacent structures.  All of the questions were answered and there is agreement to proceed.  Consent signed and in chart.  LABS PENDING   Thank you for allowing our service to participate in [name] 's care.  Electronically Signed: D. Franky Rakers, PA-C  08/09/2023, 3:49 PM      I spent a total of    25 Minutes in face to face in clinical consultation, greater than 50% of which was counseling/coordinating care for ultrasound and fluoroscopic guided hepatic cysts aspiration and polidocanol sclerotherapy with moderate sedation

## 2023-08-10 ENCOUNTER — Ambulatory Visit (HOSPITAL_COMMUNITY)
Admission: RE | Admit: 2023-08-10 | Discharge: 2023-08-10 | Disposition: A | Discharge status: Home / Self Care | Source: Ambulatory Visit | Attending: Interventional Radiology | Admitting: Interventional Radiology

## 2023-08-10 ENCOUNTER — Encounter (HOSPITAL_COMMUNITY): Payer: Self-pay

## 2023-08-10 ENCOUNTER — Other Ambulatory Visit: Payer: Self-pay

## 2023-08-10 DIAGNOSIS — K7689 Other specified diseases of liver: Secondary | ICD-10-CM | POA: Insufficient documentation

## 2023-08-10 HISTORY — PX: IR SCLEROTHERAPY OF A FLUID COLLECTION: IMG6090

## 2023-08-10 LAB — COMPREHENSIVE METABOLIC PANEL WITH GFR
ALT: 25 U/L (ref 0–44)
AST: 21 U/L (ref 15–41)
Albumin: 3.7 g/dL (ref 3.5–5.0)
Alkaline Phosphatase: 91 U/L (ref 38–126)
Anion gap: 10 (ref 5–15)
BUN: 10 mg/dL (ref 6–20)
CO2: 22 mmol/L (ref 22–32)
Calcium: 9.1 mg/dL (ref 8.9–10.3)
Chloride: 109 mmol/L (ref 98–111)
Creatinine, Ser: 0.41 mg/dL — ABNORMAL LOW (ref 0.44–1.00)
GFR, Estimated: >60 mL/min (ref >60)
Glucose, Bld: 80 mg/dL (ref 70–99)
Potassium: 3.5 mmol/L (ref 3.5–5.1)
Sodium: 141 mmol/L (ref 135–145)
Total Bilirubin: 1.1 mg/dL (ref 0.0–1.2)
Total Protein: 7 g/dL (ref 6.5–8.1)

## 2023-08-10 LAB — PROTIME-INR
INR: 1.1 (ref 0.8–1.2)
Prothrombin Time: 14.4 s (ref 11.4–15.2)

## 2023-08-10 LAB — CBC WITH DIFFERENTIAL/PLATELET
Abs Immature Granulocytes: 0.01 10*3/uL (ref 0.00–0.07)
Basophils Absolute: 0 10*3/uL (ref 0.0–0.1)
Basophils Relative: 1 %
Eosinophils Absolute: 0 10*3/uL (ref 0.0–0.5)
Eosinophils Relative: 1 %
HCT: 38.3 % (ref 36.0–46.0)
Hemoglobin: 12 g/dL (ref 12.0–15.0)
Immature Granulocytes: 0 %
Lymphocytes Relative: 24 %
Lymphs Abs: 1.2 10*3/uL (ref 0.7–4.0)
MCH: 27.3 pg (ref 26.0–34.0)
MCHC: 31.3 g/dL (ref 30.0–36.0)
MCV: 87.2 fL (ref 80.0–100.0)
Monocytes Absolute: 0.3 10*3/uL (ref 0.1–1.0)
Monocytes Relative: 6 %
Neutro Abs: 3.5 10*3/uL (ref 1.7–7.7)
Neutrophils Relative %: 68 %
Platelets: 205 10*3/uL (ref 150–400)
RBC: 4.39 MIL/uL (ref 3.87–5.11)
RDW: 14.2 % (ref 11.5–15.5)
WBC: 5.1 10*3/uL (ref 4.0–10.5)
nRBC: 0 % (ref 0.0–0.2)

## 2023-08-10 MED ORDER — MIDAZOLAM HCL 2 MG/2ML IJ SOLN
INTRAMUSCULAR | Status: AC | PRN
  Administered 2023-08-10: 1 mg via INTRAVENOUS

## 2023-08-10 MED ORDER — SODIUM CHLORIDE 0.9 % IV SOLN: INTRAVENOUS | Status: DC

## 2023-08-10 MED ORDER — MIDAZOLAM HCL 2 MG/2ML IJ SOLN
INTRAMUSCULAR | Status: AC
Start: 2023-08-10 — End: 2023-08-10
  Filled 2023-08-10: qty 2

## 2023-08-10 MED ORDER — LIDOCAINE-EPINEPHRINE 1 %-1:100000 IJ SOLN
INTRAMUSCULAR | Status: AC
  Filled 2023-08-10: qty 1

## 2023-08-10 MED ORDER — FENTANYL CITRATE (PF) 100 MCG/2ML IJ SOLN
INTRAMUSCULAR | Status: AC | PRN
  Administered 2023-08-10: 50 ug via INTRAVENOUS

## 2023-08-10 MED ORDER — FENTANYL CITRATE (PF) 100 MCG/2ML IJ SOLN
INTRAMUSCULAR | Status: AC
  Filled 2023-08-10: qty 2

## 2023-08-10 MED ORDER — LIDOCAINE-EPINEPHRINE 1 %-1:100000 IJ SOLN
20.0000 mL | Freq: Once | INTRAMUSCULAR | Status: AC
  Administered 2023-08-10: 20 mL via INTRADERMAL

## 2023-08-10 MED ORDER — IOHEXOL 300 MG/ML  SOLN
50.0000 mL | Freq: Once | INTRAMUSCULAR | Status: AC | PRN
  Administered 2023-08-10: 10 mL

## 2023-08-10 NOTE — Procedures (Signed)
 Interventional Radiology Procedure Note  Procedure: Hepatic cyst aspiration and sclerotherapy  Findings: Please refer to procedural dictation for full description. 700 mL dark brown, opaque fluid aspirated from cyst.  Large central hemorrhagic portion persistent.  10 mL 1% polidocanol + 50 cc air foam mixture administered into cystic component with 5 minute dwell time prior to aspiration.  Complications: None immediate  Estimated Blood Loss: < 5 ml  Recommendations: IR will arrange 1 month outpatient follow up with CT abdomen/pelvis.   Ester Sides, MD

## 2023-09-16 ENCOUNTER — Other Ambulatory Visit: Payer: Self-pay | Admitting: Interventional Radiology

## 2023-09-16 DIAGNOSIS — K7689 Other specified diseases of liver: Secondary | ICD-10-CM

## 2023-09-24 ENCOUNTER — Ambulatory Visit
Admission: RE | Admit: 2023-09-24 | Discharge: 2023-09-24 | Disposition: A | Discharge status: Home / Self Care | Payer: Self-pay | Source: Ambulatory Visit | Attending: Interventional Radiology | Admitting: Interventional Radiology

## 2023-09-24 DIAGNOSIS — K7689 Other specified diseases of liver: Secondary | ICD-10-CM

## 2023-09-24 MED ORDER — IOPAMIDOL (ISOVUE-300) INJECTION 61%
100.0000 mL | Freq: Once | INTRAVENOUS | Status: AC | PRN
  Administered 2023-09-24: 100 mL via INTRAVENOUS

## 2023-10-08 NOTE — Progress Notes (Signed)
 Referring Physician(s): Fondaw,Wylder S   Chief Complaint: The patient is seen in follow up today s/p hepatic cyst aspiration with sclerotherapy 08/10/23  History of present illness: HPI from initial consultation 07/09/23 [name] is a 58 y.o. female with a medical history significant for HTN, anemia and uterine fibroids. She presented to the ED 06/22/23 with RUQ abdominal pain, nausea, vomiting, right-sided neck pain with intermittent paresthesias in the right arm and palpitations. Physical exam was positive for RUQ tenderness and labs showed a slightly elevated T. Bilirubin level of 1.7. Imaging obtained showed a multifocal hepatic cyst favored to reflect a complex cyst with internal hemorrhage. Additional cystic foci throughout the liver were also present. Interventional Radiology was consulted and our team recommended follow up at our outpatient clinic.    After her symptoms were appropriately treated she was discharged from the ED with outpatient GI and IR follow up. She presents today via virtual video visit to discuss potential hepatic cyst treatment options.  Her pain is much improved, however persistent. She reports an average of 6/10 RUQ pain. She is still taking tylenol  around the clock. No fevers or chills, nausea or vomiting. Pain is exacerbated with lifting and taking deep breaths. She is following with Dr. Burnette.  We discussed the rationale, periprocedural expectations, and long term expected outcomes of hepatic cyst aspiration with sclerotherapy. She was interested in proceeding and this was performed outpatient on 08/10/23. I was able to aspirate a total of 700 ml of dark brown fluid with polidocanol sclerotherapy of the large right hepatic cyst.   A one month follow up CT abdomen/pelvis was ordered and this was completed 09/24/23. She presents to the IR clinic today for follow up and to discuss the results of the CT study.  She remains asymptomatic.  She has some complaints of  constipation which she is managing with diet and colace.  Past Medical History:  Diagnosis Date   Anemia    History of blood transfusion 11/19/2016   4 units at [facility]   Hypertension    HYPERTENSION 01/06/2008   Qualifier: Diagnosis of  By: Joshua MD, Debby CROME.    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  Medications: Prior to Admission medications   Medication Sig Start Date End Date Taking? Authorizing Provider  acetaminophen  (TYLENOL ) 325 MG tablet Take 650 mg by mouth as needed for mild pain.    [provider]  amLODipine  (NORVASC ) 10 MG tablet Take 1 tablet (10 mg total) by mouth daily. 06/21/17   Starla Harland BROCKS, MD  estradiol  (ESTRACE ) 1 MG tablet Take 2 tablets (2 mg total) by mouth daily. 11/02/17   Dove, Myra C, MD  hydrochlorothiazide (HYDRODIURIL) 25 MG tablet Take 25 mg by mouth daily.    [provider]  spironolactone  (ALDACTONE ) 25 MG tablet TAKE 1 TABLET BY MOUTH EVERY DAY 09/07/19   Patwardhan, Newman PARAS, MD     Family History  Problem Relation Age of Onset   Hypertension Mother  Breast cancer Mother    Cancer Maternal Aunt        breast   Breast cancer Maternal Aunt    Stroke Father    Heart attack Maternal Grandfather        Age 39   Stroke Paternal Grandmother     Social History   Socioeconomic History   Marital status: Divorced    Spouse name: Not on file   Number of children: 2   Years of education: Not on file   Highest education level: Not on file  Occupational History   Not on file  Tobacco Use   Smoking status: Never   Smokeless tobacco: Never  Vaping  Use   Vaping status: Never Used  Substance and Sexual Activity   Alcohol use: No   Drug use: No   Sexual activity: Not Currently    Birth control/protection: Surgical  Other Topics Concern   Not on file  Social History Narrative   Pt is separated from current husband.   Social Drivers of Corporate investment banker Strain: Not on file  Food Insecurity: Not on file  Transportation Needs: Not on file  Physical Activity: Not on file  Stress: Not on file  Social Connections: Not on file     Vital Signs: LMP 11/01/2016   Physical Exam Constitutional:      General: She is not in acute distress. HENT:     Head: Normocephalic.     Mouth/Throat:     Mouth: Mucous membranes are moist.  Eyes:     General: No scleral icterus. Cardiovascular:     Rate and Rhythm: Normal rate and regular rhythm.  Pulmonary:     Effort: No respiratory distress.  Abdominal:     General: There is no distension.  Skin:    General: Skin is warm and dry.     Coloration: Skin is not jaundiced.  Neurological:     Mental Status: She is alert and oriented to person, place, and time.     Imaging:  MR Liver 06/22/23            CT  Abdomen/pelvis 09/24/23     Labs:  CBC: Recent Labs    06/21/23 2141 08/10/23 1245  WBC 9.3 5.1  HGB 13.8 12.0  HCT 44.0 38.3  PLT 302 205    COAGS: Recent Labs    08/10/23 1245  INR 1.1    BMP: Recent Labs    06/21/23 2141 08/10/23 1245  NA 132* 141  K 4.1 3.5  CL 102 109  CO2 23 22  GLUCOSE 121* 80  BUN 14 10  CALCIUM 9.4 9.1  CREATININE 0.73 0.41*  GFRNONAA >60 >60    LIVER FUNCTION TESTS: Recent Labs    06/21/23 2141 08/10/23 1245  BILITOT 1.7* 1.1  AST 21 21  ALT 24 25  ALKPHOS 79 91  PROT 7.4 7.0  ALBUMIN 3.6 3.7    Assessment and Plan:  58 year old female with a history of symptomatic, benign appearing hepatic cysts, the largest in the right lobe with recent hemorrhage prompting emergency room evaluation. She was  found to be an excellent candidate for single session aspiration with sclerotherapy and this was performed 08/10/23.  Unfortunately, sclerotherapy with foam was not successful.  She has had resolution of pain, however.  We discussed repeating sclerotherapy with dehydrated ethanol if she becomes symptomatic or wishes to repeat this.  She would like some time to think about it.  She will call  our office and let us  know if/when she is ready to proceed with hepatic cyst aspiration and ethanol sclerotherapy, which we will plan to perform at [facility] with moderate sedation.    Ester Sides, MD Pager: [phone]    I spent a total of 25 Minutes in face to face in clinical consultation, greater than 50% of which was counseling/coordinating care for hepatic cysts.

## 2023-10-11 ENCOUNTER — Ambulatory Visit
Admission: RE | Admit: 2023-10-11 | Discharge: 2023-10-11 | Disposition: A | Discharge status: Home / Self Care | Payer: Self-pay | Source: Ambulatory Visit | Attending: Interventional Radiology | Admitting: Interventional Radiology

## 2023-10-11 DIAGNOSIS — K7689 Other specified diseases of liver: Secondary | ICD-10-CM

## 2023-10-11 HISTORY — PX: IR RADIOLOGIST EVAL & MGMT: IMG5224

## 2023-12-27 NOTE — Progress Notes (Unsigned)
 Patient name: [name] Date of birth: May 29, 1965 Date of visit: 12/28/23  Type of visit: New Patient Office Visit  Subjective   Chief concern:  Chief Complaint  Patient presents with   Follow-up    Patient needing medication refill / routine office visit    [name] is a 58 y.o. female with a PMHx of IDA, HTN, uterine fibroids who presents to [facility] clinic to establish care.  Please see Assessment and Plan for further details in problem-based format.  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 ?Heart disease Vasomotor sx of menopause  Past Surgical History Right hepatic cyst aspiration Total Hysterectomy with bilateral salpingo-oophorectomy Left eye surgery at age 83  Medications - does not have any medications Amlodipine  Estradiol  Hydrochlorothiazide Spironolactone  Tylenol  prn for pain Vitamin B12  Allergies NKDA  Family History Hypertension in both parents Mom - breast cancer diagnosed at age 77 in 2021 Aunt (mom's side) - breast cancer diagnosed in 2s  Social History Lives: [place], in apartment lives alone; has 2 children - their father has been deceased for 5 years Support: Mother + son lives in town; daughter lives in [place] 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[employer]; uninsured Function:  Independent with all ADLs and IADLs Prior PCP: Arthur Reese; wants to establish with [facility] now Tobacco use: Never used Alcohol use: Never used Illicit drug use: Denies recreational or IV drugs  ROS: +DOE for months, HA -CP, cough, fevers, chills, unexplained weight loss, abdominal pain, dysuria, vaginal bleeding, N/V/D, constipation    Objective  Today's Vitals   12/28/23 1434  BP: (!) 164/106  Pulse: 69  Temp: 98.8 F (37.1 C)  TempSrc: Oral  SpO2: 99%  Weight: 111 lb 9.6 oz (50.6 kg)  Height: 5' (1.524 m)  PainSc: 0-No pain  Body mass index is 21.8 kg/m.   Physical  Exam: Constitutional: well-appearing, well-nourished; normal weight; no acute distress HENT: normocephalic atraumatic, mucous membranes moist Eyes: conjunctiva non-erythematous; left eye implant Cardiovascular: regular rate and rhythm, no m/r/g Pulmonary/Chest: normal work of breathing on room air, lungs CTAB Abdominal: soft, non-tender, non-distended MSK: normal bulk and tone Neurological: alert & oriented x 3, no focal deficit Skin: warm and dry Extremities: BLE without edema or erythema. Psych: normal mood and behavior  Last CBC Lab Results  Component Value Date   WBC 5.1 08/10/2023   HGB 12.0 08/10/2023   HCT 38.3 08/10/2023   MCV 87.2 08/10/2023   MCH 27.3 08/10/2023   RDW 14.2 08/10/2023   PLT 205 08/10/2023   Last metabolic panel Lab Results  Component Value Date   GLUCOSE 80 08/10/2023   NA 141 08/10/2023   K 3.5 08/10/2023   CL 109 08/10/2023   CO2 22 08/10/2023   BUN 10 08/10/2023   CREATININE 0.41 (L) 08/10/2023   GFRNONAA >60 08/10/2023   CALCIUM 9.1 08/10/2023   PROT 7.0 08/10/2023   ALBUMIN 3.7 08/10/2023   BILITOT 1.1 08/10/2023   ALKPHOS 91 08/10/2023   AST 21 08/10/2023   ALT 25 08/10/2023   ANIONGAP 10 08/10/2023   Last lipids Lab Results  Component Value Date   CHOL 157 01/06/2008   HDL 76.7 01/06/2008   LDLCALC 62 01/06/2008   TRIG 94 01/06/2008   CHOLHDL 2.0 CALC 01/06/2008        Assessment & Plan  Essential hypertension Assessment & Plan: Initial blood pressure reading is 164/106.  The patient has not been taking any medications due  to losing health insurance in April 2025.  Previously she was taking HCTZ 25 mg daily, spironolactone  25 mg daily, amlodipine  10 mg daily.  The patient does have a blood pressure cuff at home.  Plan to refill prior blood pressure medications and reassess in 2 weeks for follow-up visit.  Advised the patient to record her blood pressures twice a day and bring log to her next appointment with [facility]. - Refilled  amlodipine  10 mg daily - Refilled hydrochlorothiazide 25 mg daily - Refilled spironolactone  25 mg daily - Advised patient to record blood pressure and bring log to next visit  Orders: -     amLODIPine  Besylate; Take 1 tablet (10 mg total) by mouth daily.  Dispense: 30 tablet; Refill: 11 -     hydroCHLOROthiazide; Take 1 tablet (25 mg total) by mouth daily.  Dispense: 30 tablet; Refill: 11 -     Spironolactone ; Take 1 tablet (25 mg total) by mouth daily.  Dispense: 30 tablet; Refill: 11  Chronic tension-type headache, not intractable Assessment & Plan: Since September, the patient has had daily headaches that have progressively gotten worse.  She describes the pain as feeling like a dull sensation over her forehead and also endorsing throbbing sensation at times.  She gets these headaches about twice a day and has been taking Tylenol  to help relieve the pain.  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  Denies numbness, tingling, weakness, other sensory deficits.  The patient does think this is related to her hypertension as she has not been taking medications since April. Differential diagnosis includes tension-type headache versus medication overuse headache in the setting of daily Tylenol  use.  Less likely atypical migraine.  Do not think this is necessarily related to her hypertension.  Counseled the patient to alternate Tylenol  with NSAIDs and to assess headache severity in relation to her blood pressure readings at home.  Will assess at next office visit in 2 weeks. - Alternating Tylenol /NSAIDs for pain as needed - Advised patient to record headache diary   Exertional dyspnea Assessment & Plan: Patient endorses exertional dyspnea for several years now.  She states that she will get short of breath when she walks to the mailbox which is several 100 feet away.  The patient did see cardiology (Dr.  Elmira) in 2021 with echocardiogram showing normal LVEF without diastolic dysfunction.  Plan to continue monitoring patient for symptoms especially as she as she is better but for control of blood pressure.   Encounter for screening involving social determinants of health [facility]) Assessment & Plan: The patient recently had her hours at work reduced from full-time to part-time in April 2025 which meant that she lost her health insurance.  Due to the reduced hours, her pay is much lower than it used to be and she does experience food insecurity.  The last time she experienced this was 3 weeks ago.  Offered the patient resources to contact [place] with help with obtaining health insurance as well as supplies from the [facility] food bank.   Healthcare maintenance Assessment & Plan: - Mammogram: Last done in 2022  - Colonoscopy: Does not recall last time she had colon cancer screening - Unclear if patient has had screening test such as HIV, HCV, lipid panel, A1c - Will defer laboratory testing and healthcare maintenance until patient has better plan for health insurance and records are faxed from prior PCP Dr. Arthur Reese  Return in about 2 weeks (around 01/11/2024) for HTN/HA follow up.   Patient case discussed with Dr. Shawn, who also saw and evaluated the patient.  Radley Teston, MD [facility]  PGY-1 12/28/2023, 4:08 PM

## 2023-12-28 ENCOUNTER — Other Ambulatory Visit: Payer: Self-pay

## 2023-12-28 ENCOUNTER — Ambulatory Visit: Payer: Self-pay

## 2023-12-28 VITALS — BP 164/106 | HR 69 | Temp 98.8°F | Ht 60.0 in | Wt 111.6 lb

## 2023-12-28 DIAGNOSIS — Z139 Encounter for screening, unspecified: Secondary | ICD-10-CM | POA: Insufficient documentation

## 2023-12-28 DIAGNOSIS — I10 Essential (primary) hypertension: Secondary | ICD-10-CM

## 2023-12-28 DIAGNOSIS — Z0000 Encounter for general adult medical examination without abnormal findings: Secondary | ICD-10-CM | POA: Insufficient documentation

## 2023-12-28 DIAGNOSIS — R0609 Other forms of dyspnea: Secondary | ICD-10-CM | POA: Insufficient documentation

## 2023-12-28 DIAGNOSIS — G44229 Chronic tension-type headache, not intractable: Secondary | ICD-10-CM | POA: Insufficient documentation

## 2023-12-28 MED ORDER — AMLODIPINE BESYLATE 10 MG PO TABS: 10.0000 mg | ORAL_TABLET | Freq: Every day | ORAL | 11 refills | Status: DC

## 2023-12-28 MED ORDER — HYDROCHLOROTHIAZIDE 25 MG PO TABS
25.0000 mg | ORAL_TABLET | Freq: Every day | ORAL | 11 refills | Status: AC
  Filled 2023-12-28: qty 30, 30d supply, fill #0
  Filled 2024-01-25: qty 30, 30d supply, fill #1
  Filled 2024-02-22: qty 30, 30d supply, fill #2

## 2023-12-28 MED ORDER — SPIRONOLACTONE 25 MG PO TABS
25.0000 mg | ORAL_TABLET | Freq: Every day | ORAL | 11 refills | Status: AC
  Filled 2023-12-28: qty 30, 30d supply, fill #0
  Filled 2024-01-25: qty 30, 30d supply, fill #1
  Filled 2024-02-22: qty 30, 30d supply, fill #2

## 2023-12-28 MED ORDER — HYDROCHLOROTHIAZIDE 25 MG PO TABS: 25.0000 mg | ORAL_TABLET | Freq: Every day | ORAL | 11 refills | Status: DC

## 2023-12-28 MED ORDER — AMLODIPINE BESYLATE 10 MG PO TABS
10.0000 mg | ORAL_TABLET | Freq: Every day | ORAL | 11 refills | Status: AC
  Filled 2023-12-28: qty 30, 30d supply, fill #0
  Filled 2024-01-25: qty 30, 30d supply, fill #1
  Filled 2024-02-22: qty 30, 30d supply, fill #2

## 2023-12-28 MED ORDER — SPIRONOLACTONE 25 MG PO TABS: 25.0000 mg | ORAL_TABLET | Freq: Every day | ORAL | 11 refills | Status: DC

## 2023-12-28 NOTE — Assessment & Plan Note (Signed)
 Initial blood pressure reading is 164/106.  The patient has not been taking any medications due to losing health insurance in April 2025.  Previously she was taking HCTZ 25 mg daily, spironolactone  25 mg daily, amlodipine  10 mg daily.  The patient does have a blood pressure cuff at home.  Plan to refill prior blood pressure medications and reassess in 2 weeks for follow-up visit.  Advised the patient to record her blood pressures twice a day and bring log to her next appointment with [facility]. - Refilled amlodipine  10 mg daily - Refilled hydrochlorothiazide 25 mg daily - Refilled spironolactone  25 mg daily - Advised patient to record blood pressure and bring log to next visit

## 2023-12-28 NOTE — Assessment & Plan Note (Signed)
 Patient endorses exertional dyspnea for several years now.  She states that she will get short of breath when she walks to the mailbox which is several 100 feet away.  The patient did see cardiology (Dr. Elmira) in 2021 with echocardiogram showing normal LVEF without diastolic dysfunction.  Plan to continue monitoring patient for symptoms especially as she as she is better but for control of blood pressure.

## 2023-12-28 NOTE — Assessment & Plan Note (Signed)
 The patient recently had her hours at work reduced from full-time to part-time in April 2025 which meant that she lost her health insurance.  Due to the reduced hours, her pay is much lower than it used to be and she does experience food insecurity.  The last time she experienced this was 3 weeks ago.  Offered the patient resources to contact [place] with help with obtaining health insurance as well as supplies from the [facility] food bank.

## 2023-12-28 NOTE — Assessment & Plan Note (Signed)
 Since September, the patient has had daily headaches that have progressively gotten worse.  She describes the pain as feeling like a dull sensation over her forehead and also endorsing throbbing sensation at times.  She gets these headaches about twice a day and has been taking Tylenol  to help relieve the pain.  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  Denies numbness, tingling, weakness, other sensory deficits.  The patient does think this is related to her hypertension as she has not been taking medications since April. Differential diagnosis includes tension-type headache versus medication overuse headache in the setting of daily Tylenol  use.  Less likely atypical migraine.  Do not think this is necessarily related to her hypertension.  Counseled the patient to alternate Tylenol  with NSAIDs and to assess headache severity in relation to her blood pressure readings at home.  Will assess at next office visit in 2 weeks. - Alternating Tylenol /NSAIDs for pain as needed - Advised patient to record headache diary

## 2023-12-28 NOTE — Progress Notes (Signed)
 Internal Medicine Clinic Attending  I was physically present during the key portions of the resident provided service and participated in the medical decision making of patient's management care. I reviewed pertinent patient test results.  The assessment, diagnosis, and plan were formulated together and I agree with the documentation in the resident's note.  Shawn Sick, MD

## 2023-12-28 NOTE — Patient Instructions (Addendum)
 Thank you, Ms.Dura A Fosdick for allowing us  to provide your care today. Today we discussed the following:  - For your blood pressure, I have sent prescriptions of amlodipine , hydrochlorothiazide, spironolactone  to the [facility].  Please take these medications daily. - I would also like for you to use your blood pressure cuff to her record your blood pressure twice daily.  Preferably once before you take your medications, and once after you take your medications later in the day.  Please bring your blood pressure log to your next visit in 2 weeks. - For your headaches, please alternate Tylenol  and NSAIDs (ibuprofen , Motrin , Aleve) as your body can get used to Tylenol  and make the headaches worse.  Also try to note whether you are headaches improve if your blood pressure also improves after starting medications. - Please contact Eric for financial assistance and further information about obtaining health insurance.  I have ordered the following labs for you:  Lab Orders  No laboratory test(s) ordered today     Tests ordered today:  None  Referrals ordered today:   Referral Orders  No referral(s) requested today     I have ordered the following medication/changed the following medications:   Stop the following medications: Medications Discontinued During This Encounter  Medication Reason   amLODipine  (NORVASC ) 10 MG tablet Cost of medication   hydrochlorothiazide (HYDRODIURIL) 25 MG tablet Cost of medication   spironolactone  (ALDACTONE ) 25 MG tablet Cost of medication   amLODipine  (NORVASC ) 10 MG tablet    hydrochlorothiazide (HYDRODIURIL) 25 MG tablet    spironolactone  (ALDACTONE ) 25 MG tablet      Start the following medications: Meds ordered this encounter  Medications   DISCONTD: amLODipine  (NORVASC ) 10 MG tablet    Sig: Take 1 tablet (10 mg total) by mouth daily.    Dispense:  30 tablet    Refill:  11   DISCONTD: hydrochlorothiazide (HYDRODIURIL)  25 MG tablet    Sig: Take 1 tablet (25 mg total) by mouth daily.    Dispense:  30 tablet    Refill:  11   DISCONTD: spironolactone  (ALDACTONE ) 25 MG tablet    Sig: Take 1 tablet (25 mg total) by mouth daily.    Dispense:  30 tablet    Refill:  11   amLODipine  (NORVASC ) 10 MG tablet    Sig: Take 1 tablet (10 mg total) by mouth daily.    Dispense:  30 tablet    Refill:  11   hydrochlorothiazide (HYDRODIURIL) 25 MG tablet    Sig: Take 1 tablet (25 mg total) by mouth daily.    Dispense:  30 tablet    Refill:  11   spironolactone  (ALDACTONE ) 25 MG tablet    Sig: Take 1 tablet (25 mg total) by mouth daily.    Dispense:  30 tablet    Refill:  11     Follow up: 2 weeks    Remember:   Should you have any questions or concerns please call the Internal Medicine Clinic at [phone].     Anissa Abbs, MD [facility]

## 2023-12-28 NOTE — Assessment & Plan Note (Signed)
-   Mammogram: Last done in 2022  - Colonoscopy: Does not recall last time she had colon cancer screening - Unclear if patient has had screening test such as HIV, HCV, lipid panel, A1c - Will defer laboratory testing and healthcare maintenance until patient has better plan for health insurance and records are faxed from prior PCP Dr. Arthur Reese

## 2023-12-29 ENCOUNTER — Other Ambulatory Visit: Payer: Self-pay

## 2024-01-11 ENCOUNTER — Ambulatory Visit: Payer: Self-pay

## 2024-01-11 VITALS — BP 114/77 | HR 89 | Temp 98.0°F | Ht 60.0 in | Wt 106.0 lb

## 2024-01-11 DIAGNOSIS — Z5971 Insufficient health insurance coverage: Secondary | ICD-10-CM

## 2024-01-11 DIAGNOSIS — Z0000 Encounter for general adult medical examination without abnormal findings: Secondary | ICD-10-CM

## 2024-01-11 DIAGNOSIS — I10 Essential (primary) hypertension: Secondary | ICD-10-CM

## 2024-01-11 DIAGNOSIS — G44229 Chronic tension-type headache, not intractable: Secondary | ICD-10-CM

## 2024-01-11 DIAGNOSIS — Z8249 Family history of ischemic heart disease and other diseases of the circulatory system: Secondary | ICD-10-CM

## 2024-01-11 DIAGNOSIS — D509 Iron deficiency anemia, unspecified: Secondary | ICD-10-CM

## 2024-01-11 DIAGNOSIS — Z79899 Other long term (current) drug therapy: Secondary | ICD-10-CM

## 2024-01-11 DIAGNOSIS — D62 Acute posthemorrhagic anemia: Secondary | ICD-10-CM

## 2024-01-11 NOTE — Assessment & Plan Note (Signed)
 Patient has a history of iron deficiency anemia previously due to acute blood loss from her uterine fibroids, though she has since had a total hysterectomy.  She denies further bleeding, and last CBC in 07/2023 Hgb was normal.  She would like to obtain CBC today, and we discussed out-of-pocket expense would be roughly $32 though pricing could vary, which patient understands and still wants to proceed rather than wait for insurance coverage.  Discussed with her that I will send her a MyChart message or call her with the results of this. - f/u CBC

## 2024-01-11 NOTE — Assessment & Plan Note (Signed)
 Patient presents today for a 2-week follow-up for hypertension.  At her last visit with Dr. Tan on 11/11 she was resumed on amlodipine  10 mg daily, hydrochlorothiazide  25 mg daily, spironolactone  25 mg daily.  She has been keeping a blood pressure log at home, though reports some very low readings though she is asymptomatic and feels her normal self at these times so she is working on getting a new cuff.  Today in the office blood pressure normotensive at 114/77.  She also reports that her headaches have resolved now that she has resumed her medications.  Would like to check her BMP today, and she and I discussed that we can check it today which would be an out-of-pocket cost, or we can wait until she has health insurance to check these and that I am okay with either option.  We did check with lab, who stated that a BMP would be roughly $42 and a CBC would be roughly $32, though the pricing can vary.  Patient voiced understanding, and stated that she would like to proceed with the labs today to go ahead and check since she resumed her medications.  I will call or send her a MyChart message with her results. - Continue Amlodipine  10 mg daily - Continue Hydrochlorothiazide  25 mg daily - Continue Spironolactone  25 mg daily - f/u BMP

## 2024-01-11 NOTE — Progress Notes (Signed)
 Patient name: [name] Date of birth: [birth year] Date of visit: 01/11/24  Type of visit: Established Patient Office Visit   Subjective   Chief concern:  Chief Complaint  Patient presents with   Follow-up   Hypertension    [name] is a 58 y.o. female with a history of HTN, IDA, and uterine fibroids, who presents to [facility] clinic for hypertension follow up.  Patient was last seen by Dr. Waymond on in the [facility] on 12/28/2023 to establish care.  She has a history of hypertension and was previously on medication, though she lost her health insurance after her work hours were reduced earlier this year and had been off of medications for many months.  She was also having significant headaches that were felt to be related to her elevated blood pressure.  Dr. Ellard resumed her amlodipine  10 mg daily, hydrochlorothiazide  25 mg daily, spironolactone  25 mg daily.  She reports complete adherence to these medications since then, and blood pressure in the office today is 114/77.  She does also check her blood pressures at home though notes that sometimes her cuff reads very low, and she is asymptomatic and feels like her normal self, so she is working on getting a new cuff.  She reports that she had 1 headache that was rated a 4/10 immediately following her visit with Dr. Waymond, though she has not had a headache since then.  She is currently still waiting on her health insurance, and is working with [facility] to discuss possible options for assistance.  She was previously being managed by IR for a complex hepatic cyst.  She has had successful ultrasound and fluoroscopic guided aspiration of the cyst, and IR plans to do further procedures though they are waiting for her to obtain insurance.  She has been instructed by IR that if right upper quadrant pain returns, then she should present immediately to the ED.  Currently though, she is not experiencing right upper quadrant pain.  ROS: Denies headaches,  dizziness, fever, chills, runny nose, sore throat, vision changes, hearing changes, chest pain, shortness of breath, difficulty breathing, nausea, vomiting, abdominal pain. Denies increased urinary frequency, pain with urination, constipation or diarrhea. No recent falls.   Patient Active Problem List   Diagnosis Date Noted   Iron deficiency anemia 01/11/2024   Chronic tension-type headache, not intractable 12/28/2023   Exertional dyspnea 12/28/2023   Encounter for screening involving social determinants of health (SDoH) 12/28/2023   Healthcare maintenance 12/28/2023   Steatocystoma 04/03/2011   Essential hypertension 01/06/2008     Past Surgical History:  Procedure Laterality Date   CESAREAN SECTION     2 previous   IR RADIOLOGIST EVAL & MGMT  07/09/2023   IR RADIOLOGIST EVAL & MGMT  10/11/2023   IR SCLEROTHERAPY OF A FLUID COLLECTION  08/10/2023   TRANFUSION  11/4016   RECEIVED 4 UNITS OF BLOOD    TUBAL LIGATION       Current Outpatient Medications  Medication Instructions   acetaminophen  (TYLENOL ) 650 mg, As needed   amLODipine  (NORVASC ) 10 mg, Oral, Daily   hydrochlorothiazide  (HYDRODIURIL ) 25 mg, Oral, Daily   spironolactone  (ALDACTONE ) 25 mg, Oral, Daily    Social History   Tobacco Use   Smoking status: Never   Smokeless tobacco: Never  Vaping Use   Vaping status: Never Used  Substance Use Topics   Alcohol use: No   Drug use: No      Objective  Today's Vitals  01/11/24 0818  BP: 114/77  Pulse: 89  Temp: 98 F (36.7 C)  TempSrc: Oral  SpO2: 100%  Weight: 106 lb (48.1 kg)  Height: 5' (1.524 m)  Body mass index is 20.7 kg/m.   Physical Exam:   Constitutional: well-appearing female sitting in exam chair, in no acute distress. Ambulates without use of assistance device HEENT: normocephalic atraumatic, mucous membranes moist Cardiovascular: regular rate and rhythm, bilateral radial pulses 2+, bilateral dorsal pedal pulses 2+, brisk capillary refill  bilateral feet and hands  Pulmonary/Chest: normal work of breathing on room air, lungs clear to auscultation bilaterally Abdominal: soft, non-tender, non-distended MSK: normal bulk and tone. Neurological: alert & oriented x 3 Skin: warm and dry Psych: mood calm, behavior normal, thought content normal, judgement normal      The ASCVD Risk score (Arnett DK, et al., 2019) failed to calculate for the following reasons:   Cannot find a previous HDL lab   Cannot find a previous total cholesterol lab      Assessment & Plan  Problem List Items Addressed This Visit       Cardiovascular and Mediastinum   Essential hypertension - Primary   Patient presents today for a 2-week follow-up for hypertension.  At her last visit with Dr. Tan on 11/11 she was resumed on amlodipine  10 mg daily, hydrochlorothiazide  25 mg daily, spironolactone  25 mg daily.  She has been keeping a blood pressure log at home, though reports some very low readings though she is asymptomatic and feels her normal self at these times so she is working on getting a new cuff.  Today in the office blood pressure normotensive at 114/77.  She also reports that her headaches have resolved now that she has resumed her medications.  Would like to check her BMP today, and she and I discussed that we can check it today which would be an out-of-pocket cost, or we can wait until she has health insurance to check these and that I am okay with either option.  We did check with lab, who stated that a BMP would be roughly $42 and a CBC would be roughly $32, though the pricing can vary.  Patient voiced understanding, and stated that she would like to proceed with the labs today to go ahead and check since she resumed her medications.  I will call or send her a MyChart message with her results. - Continue Amlodipine  10 mg daily - Continue Hydrochlorothiazide  25 mg daily - Continue Spironolactone  25 mg daily - f/u BMP       Relevant Orders   Basic  metabolic panel with GFR     Other   Chronic tension-type headache, not intractable   Since patient began her antihypertensive medication, she reported only 1 headache just after she saw Dr. Waymond on 11/11 that was rated a 4/10 and she did not use Tylenol  for.  She reports that she has not had any further headaches since then and so suspect that her headaches were likely related to her significantly elevated blood pressure.  Will continue to monitor this.      Healthcare maintenance   At this time patient still does not have health insurance, and we discussed that once she is I would like for her to come back into the office so we can have her routine screening completed.  She will need a mammogram, colonoscopy, blood testing including HIV, HCV, lipid panel, A1c, TSH.  She voiced understanding that she will schedule a follow-up appointment  in the [facility] so that we can start these.      Iron deficiency anemia   Patient has a history of iron deficiency anemia previously due to acute blood loss from her uterine fibroids, though she has since had a total hysterectomy.  She denies further bleeding, and last CBC in 07/2023 Hgb was normal.  She would like to obtain CBC today, and we discussed out-of-pocket expense would be roughly $32 though pricing could vary, which patient understands and still wants to proceed rather than wait for insurance coverage.  Discussed with her that I will send her a MyChart message or call her with the results of this. - f/u CBC      RESOLVED: Acute blood loss anemia   Relevant Orders   CBC no Diff    Return for annual physical .  Patient discussed with Dr. Jeanelle, who also saw and evaluated the patient.  Doyal Miyamoto, MD [place] [facility]  PGY-1 01/11/2024, 11:57 AM

## 2024-01-11 NOTE — Patient Instructions (Signed)
 Thank you, Ms.[name] for allowing us  to provide your care today. Today we discussed the following:  - Please continue to take your blood pressure readings at home - Once you have insurance, please schedule a follow up with us  so we can take care of your routine screening needs!   I have ordered the following labs for you:   Lab Orders         CBC no Diff         Basic metabolic panel with GFR        I have ordered the following medication/changed the following medications:   Stop the following medications: Medications Discontinued During This Encounter  Medication Reason   estradiol  (ESTRACE ) 1 MG tablet      Follow up: for your annual physical once you have insurance     Should you have any questions or concerns please call the [facility] Medicine [facility] at [phone].     Doyal Miyamoto, MD [facility]

## 2024-01-11 NOTE — Assessment & Plan Note (Addendum)
 At this time patient still does not have health insurance, and we discussed that once she is I would like for her to come back into the office so we can have her routine screening completed.  She will need a mammogram, colonoscopy, blood testing including HIV, HCV, lipid panel, A1c, TSH.  She voiced understanding that she will schedule a follow-up appointment in the [facility] so that we can start these.

## 2024-01-11 NOTE — Assessment & Plan Note (Signed)
 Since patient began her antihypertensive medication, she reported only 1 headache just after she saw Dr. Waymond on 11/11 that was rated a 4/10 and she did not use Tylenol  for.  She reports that she has not had any further headaches since then and so suspect that her headaches were likely related to her significantly elevated blood pressure.  Will continue to monitor this.

## 2024-01-12 ENCOUNTER — Ambulatory Visit: Payer: Self-pay

## 2024-01-12 LAB — CBC
Hematocrit: 40 % (ref 34.0–46.6)
Hemoglobin: 13.1 g/dL (ref 11.1–15.9)
MCH: 28.2 pg (ref 26.6–33.0)
MCHC: 32.8 g/dL (ref 31.5–35.7)
MCV: 86 fL (ref 79–97)
Platelets: 208 x10E3/uL (ref 150–450)
RBC: 4.65 x10E6/uL (ref 3.77–5.28)
RDW: 12.5 % (ref 11.7–15.4)
WBC: 5.3 x10E3/uL (ref 3.4–10.8)

## 2024-01-12 LAB — BASIC METABOLIC PANEL WITH GFR
BUN/Creatinine Ratio: 26 — ABNORMAL HIGH (ref 9–23)
BUN: 18 mg/dL (ref 6–24)
CO2: 24 mmol/L (ref 20–29)
Calcium: 10.7 mg/dL — ABNORMAL HIGH (ref 8.7–10.2)
Chloride: 97 mmol/L (ref 96–106)
Creatinine, Ser: 0.68 mg/dL (ref 0.57–1.00)
Glucose: 71 mg/dL (ref 70–99)
Potassium: 4.1 mmol/L (ref 3.5–5.2)
Sodium: 135 mmol/L (ref 134–144)
eGFR: 100 mL/min/1.73 (ref >59)

## 2024-01-13 NOTE — Progress Notes (Signed)
 Internal Medicine Clinic Attending  I was physically present during the key portions of the resident provided service and participated in the medical decision making of patient's management care. I reviewed pertinent patient test results.  The assessment, diagnosis, and plan were formulated together and I agree with the documentation in the resident's note.  Jeanelle Layman CROME, MD

## 2024-01-25 ENCOUNTER — Other Ambulatory Visit: Payer: Self-pay

## 2024-02-22 ENCOUNTER — Other Ambulatory Visit: Payer: Self-pay

## 2024-02-24 ENCOUNTER — Other Ambulatory Visit: Payer: Self-pay
